# Patient Record
Sex: Female | Born: 1980 | Race: White | Hispanic: No | Smoking: Current every day smoker
Health system: Southern US, Community
[De-identification: ages and names within clinical notes are randomized; demographics above are authoritative.]

## PROBLEM LIST (undated history)

## (undated) ENCOUNTER — Emergency Department (HOSPITAL_COMMUNITY): Admission: EM | Payer: Medicaid - Out of State | Source: Home / Self Care

## (undated) DIAGNOSIS — J449 Chronic obstructive pulmonary disease, unspecified: Secondary | ICD-10-CM

## (undated) HISTORY — PX: CHOLECYSTECTOMY: SHX55

## (undated) HISTORY — PX: ABDOMINAL HYSTERECTOMY: SHX81

---

## 2012-10-02 DIAGNOSIS — R0602 Shortness of breath: Secondary | ICD-10-CM

## 2016-02-15 ENCOUNTER — Encounter: Payer: Self-pay | Admitting: Emergency Medicine

## 2016-02-15 ENCOUNTER — Emergency Department
Admission: EM | Admit: 2016-02-15 | Discharge: 2016-02-15 | Disposition: A | Discharge status: Home / Self Care | Payer: Medicaid - Out of State | Attending: Emergency Medicine | Admitting: Emergency Medicine

## 2016-02-15 DIAGNOSIS — Y999 Unspecified external cause status: Secondary | ICD-10-CM | POA: Insufficient documentation

## 2016-02-15 DIAGNOSIS — G8929 Other chronic pain: Secondary | ICD-10-CM | POA: Insufficient documentation

## 2016-02-15 DIAGNOSIS — F17210 Nicotine dependence, cigarettes, uncomplicated: Secondary | ICD-10-CM | POA: Insufficient documentation

## 2016-02-15 DIAGNOSIS — X500XXA Overexertion from strenuous movement or load, initial encounter: Secondary | ICD-10-CM | POA: Insufficient documentation

## 2016-02-15 DIAGNOSIS — M5442 Lumbago with sciatica, left side: Secondary | ICD-10-CM | POA: Insufficient documentation

## 2016-02-15 DIAGNOSIS — Y92009 Unspecified place in unspecified non-institutional (private) residence as the place of occurrence of the external cause: Secondary | ICD-10-CM | POA: Insufficient documentation

## 2016-02-15 DIAGNOSIS — Y9389 Activity, other specified: Secondary | ICD-10-CM | POA: Insufficient documentation

## 2016-02-15 LAB — URINALYSIS COMPLETE WITH MICROSCOPIC (ARMC ONLY)
BILIRUBIN URINE: NEGATIVE
Glucose, UA: NEGATIVE mg/dL
KETONES UR: NEGATIVE mg/dL
LEUKOCYTES UA: NEGATIVE
NITRITE: POSITIVE — AB
PH: 5 (ref 5.0–8.0)
PROTEIN: NEGATIVE mg/dL
SPECIFIC GRAVITY, URINE: 1.011 (ref 1.005–1.030)

## 2016-02-15 MED ORDER — ORPHENADRINE CITRATE ER 100 MG PO TB12: 100.0000 mg | ORAL_TABLET | Freq: Two times a day (BID) | ORAL | 0 refills | Status: AC | PRN

## 2016-02-15 MED ORDER — DEXAMETHASONE SODIUM PHOSPHATE 10 MG/ML IJ SOLN
10.0000 mg | Freq: Once | INTRAMUSCULAR | Status: AC
  Administered 2016-02-15: 10 mg via INTRAMUSCULAR
  Filled 2016-02-15: qty 1

## 2016-02-15 MED ORDER — PREDNISONE 10 MG PO TABS: 10.0000 mg | ORAL_TABLET | Freq: Two times a day (BID) | ORAL | 0 refills | Status: AC

## 2016-02-15 MED ORDER — ORPHENADRINE CITRATE 30 MG/ML IJ SOLN
60.0000 mg | INTRAMUSCULAR | Status: AC
  Administered 2016-02-15: 60 mg via INTRAMUSCULAR
  Filled 2016-02-15: qty 2

## 2016-02-15 MED ORDER — OXYCODONE-ACETAMINOPHEN 5-325 MG PO TABS
1.0000 | ORAL_TABLET | Freq: Once | ORAL | Status: AC
  Administered 2016-02-15: 1 via ORAL
  Filled 2016-02-15: qty 1

## 2016-02-15 MED ORDER — HYDROCODONE-ACETAMINOPHEN 5-325 MG PO TABS: 1.0000 | ORAL_TABLET | Freq: Four times a day (QID) | ORAL | 0 refills | Status: AC | PRN

## 2016-02-15 NOTE — ED Notes (Signed)
Pt states she took her own ibuprofen because "I don't like hospitals."

## 2016-02-15 NOTE — ED Triage Notes (Signed)
Pt has hx of chronic back pain and today was picking up boxes and reinjured back. Pt has left sided back pain radiates down left buttocks and posterior left leg.

## 2016-02-15 NOTE — ED Notes (Signed)
Pt in via triage; reports helping a friend move today, straining back because boxes were too heavy.  Pt with complaints of left sciatic pain.  Pt with hx of same.  Pt to room via wheelchair; reports difficulty ambulating due to pain.  No immediate distress noted at this time.

## 2016-02-15 NOTE — Discharge Instructions (Signed)
Take the prescription meds as directed. Apply ice to the sore muscles of the lower back for additional symptom relief. Follow-up with [facility] for further treatment.   Emergency care providers appreciate that many patients coming to us are in severe pain and we wish to address their pain in the safest, most responsible manner.  It is important to recognize, however, that the proper treatment of chronic pain differs from that of the pain of injuries and acute illnesses.  Our goal is to provider quality, safe, personalized care and we thank you for giving us the opportunity to serve you.  The use of narcotics and related agents for chronic pain syndromes may lead to additional physical and psychological problems.  Nearly as many people die from prescription narcotics each year as die from car crashes.  Additionally, this risk is increased if such prescriptions are obtained from a variety of sources.  Therefore, only your primary care physician or a pain management specialist is able to safely treat such syndromes with narcotic medications long-term.  Documentation revealing such prescriptions have been sought from multiple sources may prohibit us from providing a refill or different narcotic medication.  Your name may be checked first through the [facility] Controlled Substances Reporting System.  This database is a record of controlled substance medication prescriptions that the patient has received.  This has been established by [facility] in an effort to eliminate the dangerous, and often life-threatening, practice of obtaining multiple prescriptions from different medical providers.  If you have a chronic pain syndrome (i.e. chronic headaches, recurrent back or neck pain, dental pain, abdominal or pelvic pain without a specific diagnosis, or neuropathic pain such as fibromyalgia) or recurrent visits for the same condition without an acute diagnosis, you may be treated with non-narcotics and  other non-addictive medicines.  Allergic reactions or negative side effects that may be reported by a patient to such medications will not typically lead to the use of a narcotic analgesic or other controlled substance as an alternative.  Patients managing chronic pain with a personal physician should have provisions in place for breakthrough pain.  If you are in crisis, you should call your physician.  If your physician directs you to the emergency department, please have the doctor call and speak to our attending physician concerning your care.  When patients come to the Emergency Department (ED) with acute medical conditions in which the ED physician feels it is appropriate to prescribe narcotic or sedating pain medication, the physician will prescribe these in very limited quantities.  The amount of these medications will last only until you can see your primary care physician in his/her office.  Any patient who returns to the ED seeking refills should expect only non-narcotic pain medications.  In the event an acute medical condition exists and the emergency physician feels it is necessary that the patient be given a narcotic or sedating medication, a responsible adult driver should be present in the room prior to the medication being given by the nurse.  Prescriptions for narcotic or sedating medications that have been lost, stolen, or expired will NOT be refilled in the ED.  Patients who have chronic pain may receive non-narcotic prescriptions until seen by their primary care physician.  It is every patient's personal responsibility to maintain active prescriptions with his or her primary care physician or specialist.

## 2016-02-20 ENCOUNTER — Emergency Department: Payer: Self-pay

## 2016-02-20 ENCOUNTER — Emergency Department
Admission: EM | Admit: 2016-02-20 | Discharge: 2016-02-20 | Disposition: A | Discharge status: Home / Self Care | Payer: Self-pay | Attending: Emergency Medicine | Admitting: Emergency Medicine

## 2016-02-20 ENCOUNTER — Encounter: Payer: Self-pay | Admitting: Emergency Medicine

## 2016-02-20 DIAGNOSIS — R1013 Epigastric pain: Secondary | ICD-10-CM | POA: Insufficient documentation

## 2016-02-20 DIAGNOSIS — R1310 Dysphagia, unspecified: Secondary | ICD-10-CM | POA: Insufficient documentation

## 2016-02-20 DIAGNOSIS — R079 Chest pain, unspecified: Secondary | ICD-10-CM

## 2016-02-20 DIAGNOSIS — F17210 Nicotine dependence, cigarettes, uncomplicated: Secondary | ICD-10-CM | POA: Insufficient documentation

## 2016-02-20 DIAGNOSIS — Z79899 Other long term (current) drug therapy: Secondary | ICD-10-CM | POA: Insufficient documentation

## 2016-02-20 MED ORDER — FAMOTIDINE IN NACL 20-0.9 MG/50ML-% IV SOLN
20.0000 mg | Freq: Once | INTRAVENOUS | Status: AC
  Administered 2016-02-20: 20 mg via INTRAVENOUS
  Filled 2016-02-20: qty 50

## 2016-02-20 MED ORDER — OMEPRAZOLE 40 MG PO CPDR: 40.0000 mg | DELAYED_RELEASE_CAPSULE | Freq: Every day | ORAL | 0 refills | Status: AC

## 2016-02-20 MED ORDER — FENTANYL CITRATE (PF) 100 MCG/2ML IJ SOLN
50.0000 ug | Freq: Once | INTRAMUSCULAR | Status: AC
  Administered 2016-02-20: 50 ug via INTRAVENOUS
  Filled 2016-02-20: qty 2

## 2016-02-20 MED ORDER — ONDANSETRON HCL 4 MG/2ML IJ SOLN
4.0000 mg | Freq: Once | INTRAMUSCULAR | Status: AC
  Administered 2016-02-20: 4 mg via INTRAVENOUS
  Filled 2016-02-20: qty 2

## 2016-02-20 NOTE — ED Provider Notes (Signed)
[facility] Emergency Department Provider Note ____________________________________________  Time seen: 2108  I have reviewed the triage vital signs and the nursing notes.  HISTORY  Chief Complaint  Back Pain  HPI Sandy Becker is a 35 y.o. female presents to the ED accompanied by family friend for evaluation of pain which is consistent with her left-sided sciatica after injury today. Patient describes she was moving furniture and boxes at home, when she apparently "over did it."She denies any incontinence of bladder or bowel. She reports her baseline pain with referral down the left buttocks into the foot. She describes difficulty walking and transitioning. She denies any fall, accident, or trauma. He reports she is recently relocated from [place], and has not had any recent prescriptions for her chronic pain medicines which include hydrocodone 10 mg. She also denies taking any other medications in the interim for pain relief. She presents now for evaluation and management. She notes that in the past when she has reported to the emergency department, her pain has beenaddressed with Dilaudid injections.  No past medical history on file.  There are no active problems to display for this patient.  Past Surgical History:  Procedure Laterality Date  . ABDOMINAL HYSTERECTOMY     partial  . CHOLECYSTECTOMY      Prior to Admission medications   Medication Sig Start Date End Date Taking? Authorizing Provider  HYDROcodone-acetaminophen (NORCO) 5-325 MG tablet Take 1 tablet by mouth every 6 (six) hours as needed. 02/15/16   Tammy Ericsson V Bacon Fredis Malkiewicz, PA-C  orphenadrine (NORFLEX) 100 MG tablet Take 1 tablet (100 mg total) by mouth 2 (two) times daily as needed for muscle spasms. 02/15/16   Arely Tinner V Bacon Anarosa Kubisiak, PA-C  predniSONE (DELTASONE) 10 MG tablet Take 1 tablet (10 mg total) by mouth 2 (two) times daily with a meal. 02/15/16   Malani Lees V Bacon Agnes Probert, PA-C     Allergies Prednisone; Red dye; Toradol [ketorolac tromethamine]; and Tramadol  No family history on file.  Social History Social History  Substance Use Topics  . Smoking status: Current Every Day Smoker    Packs/day: 0.50    Types: Cigarettes  . Smokeless tobacco: Never Used  . Alcohol use No    Review of Systems  Constitutional: Negative for fever. Cardiovascular: Negative for chest pain. Respiratory: Negative for shortness of breath. Gastrointestinal: Negative for abdominal pain, vomiting and diarrhea. Genitourinary: Negative for dysuria. Musculoskeletal: Positive for left-sided lower back pain. Skin: Negative for rash. Neurological: Negative for headaches, focal weakness or numbness. ____________________________________________  PHYSICAL EXAM:  VITAL SIGNS: ED Triage Vitals  Enc Vitals Group     BP 02/15/16 1932 136/82     Pulse Rate 02/15/16 1930 (!) 101     Resp 02/15/16 1930 18     Temp 02/15/16 1930 97.7 F (36.5 C)     Temp Source 02/15/16 1930 Oral     SpO2 02/15/16 1930 97 %     Weight 02/15/16 1930 190 lb (86.2 kg)     Height 02/15/16 1930 5\' 5"  (1.651 m)     Head Circumference --      Peak Flow --      Pain Score 02/15/16 1931 9     Pain Loc --      Pain Edu? --      Excl. in GC? --    Constitutional: Alert and oriented. Well appearing and in no distress. Head: Normocephalic and atraumatic. Cardiovascular: Normal rate, regular rhythm. Normal distal pulses. Respiratory:  Normal respiratory effort. No wheezes/rales/rhonchi. Gastrointestinal: Soft and nontender. No distention. Musculoskeletal: Normal spinal alignment without midline tenderness, spasm, deformity, or step-off. Patient is able transition from supine to sit without assistance. She's got a negative CT history leg raise bilaterally. His able transition from sit to stand without difficulty. Her toe raise and heel raise is strong. She has normal single leg stand. Nontender with normal range  of motion in all extremities.  Neurologic:  Antalgic gait without ataxia. Cranial nerves II through XII grossly intact. Normal LE DTRs bilaterally. Normal toe dorsiflexion and foot eversion on exam. Normal speech and language. No gross focal neurologic deficits are appreciated. Skin:  Skin is warm, dry and intact. No rash noted. Psychiatric: Mood and affect are normal. Patient exhibits appropriate insight and judgment. ____________________________________________   RADIOLOGY Deferred ____________________________________________  PROCEDURES  Decadron 10 mg IM Norflex 60 mg IM ____________________________________________  INITIAL IMPRESSION / ASSESSMENT AND PLAN / ED COURSE  Patient was systems consistent with her chronic left-sided low back pain and sciatica. No indication of any acute neuromuscular deficit on exam. Her pain is improved following IM injection administration. She is discharged at this time with prescriptions for prednisone, Norflex, and hydrocodone 5-3 25 (#10). She will follow with [facility] for ongoing symptom management. Review of the [place] and [place] controlled substance database is a did not reveal any current prescriptions for narcotic pain medicines.  Clinical Course    ____________________________________________  FINAL CLINICAL IMPRESSION(S) / ED DIAGNOSES  Final diagnoses:  Chronic left-sided low back pain with left-sided sciatica     Lissa HoardJenise V Bacon Arvin Abello, PA-C 02/20/16 1310    Phineas SemenGraydon Goodman, MD 02/23/16 [phone]

## 2016-02-20 NOTE — Discharge Instructions (Signed)
Please drink any fluid to stay well-hydrated. You may take Tylenol for your pain, but avoid NSAID medications until you have followed up with the gastroenterologist.  Return to the emergency department if you develop severe pain, inability to keep down fluids, fever, or any other symptoms concerning to you.

## 2016-02-20 NOTE — ED Notes (Addendum)
Pt having no issues at this time with PO challenge , MD notified

## 2016-02-20 NOTE — ED Triage Notes (Signed)
C/O "something is stuck or blocking" in esophagus   All PO intake "comes back up" x 3 days.

## 2016-02-20 NOTE — ED Provider Notes (Signed)
[facility] Emergency Department Provider Note  ____________________________________________  Time seen: Approximately 2:38 PM  I have reviewed the triage vital signs and the nursing notes.   HISTORY  Chief Complaint unable to eat or drink     HPI Sandy Becker is a 35 y.o. female presenting for acute pain with vomiting with swallowing. The patient reports that for the past 3 days, anytime she tries to eat or drink anything, she develops a severe substernal pain and then coughs or vomits. She does not remember any acute episode where this may have started. She has not had any associated shortness of breath, drooling, fever.  Patient was seen here 11/2 for sciatica and treated with Norflex, and prednisone.   History reviewed. No pertinent past medical history.  There are no active problems to display for this patient.   Past Surgical History:  Procedure Laterality Date  . ABDOMINAL HYSTERECTOMY     partial  . CHOLECYSTECTOMY      Current Outpatient Rx  . Order #: [id number] Class: Print  . Order #: [id number] Class: Print  . Order #: [id number] Class: Print  . Order #: [id number] Class: Print    Allergies Prednisone; Red dye; Toradol [ketorolac tromethamine]; and Tramadol  No family history on file.  Social History Social History  Substance Use Topics  . Smoking status: Current Every Day Smoker    Packs/day: 0.50    Types: Cigarettes  . Smokeless tobacco: Never Used  . Alcohol use No    Review of Systems Constitutional: No fever/chills. Eyes: No visual changes. ENT: No sore throat. No congestion or rhinorrhea. Cardiovascular: Denies chest pain. Denies palpitations. Respiratory: Denies shortness of breath.  Positive cough with swallowing. Gastrointestinal: Positive epigastric abdominal pain.  No nausea, no vomiting.  No diarrhea.  No constipation. Genitourinary: Negative for dysuria. Musculoskeletal: Negative for back pain. Skin:  Negative for rash. Neurological: Negative for headaches. No focal numbness, tingling or weakness.   10-point ROS otherwise negative.  ____________________________________________   PHYSICAL EXAM:  VITAL SIGNS: ED Triage Vitals  Enc Vitals Group     BP 02/20/16 1259 117/88     Pulse Rate 02/20/16 1259 88     Resp 02/20/16 1259 16     Temp 02/20/16 1253 98.1 F (36.7 C)     Temp Source 02/20/16 1253 Oral     SpO2 02/20/16 1259 98 %     Weight 02/20/16 1254 180 lb (81.6 kg)     Height 02/20/16 1254 5\' 5"  (1.651 m)     Head Circumference --      Peak Flow --      Pain Score 02/20/16 1254 0     Pain Loc --      Pain Edu? --      Excl. in GC? --     Constitutional: Alert and oriented. Well appearing and in no acute distress. Answers questions appropriately. Eyes: Conjunctivae are normal.  EOMI. No scleral icterus. Head: Atraumatic.No meningismus. Nose: No congestion/rhinnorhea. Mouth/Throat: Mucous membranes are moist. No drooling. Posterior pharynx is without erythema, tonsillar swelling or exudate. The posterior palate is symmetric and uvula is midline. Neck: No stridor.  Supple.  No meningismus. Cardiovascular: Normal rate, regular rhythm. No murmurs, rubs or gallops.  Respiratory: Normal respiratory effort.  No accessory muscle use or retractions. Lungs CTAB.  No wheezes, rales or ronchi. Gastrointestinal: Soft, nontender and nondistended.  No guarding or rebound.  No peritoneal signs. Musculoskeletal: No LE edema. No ttp in the calves or palpable cords.  Negative Homan's sign. Neurologic:  A&Ox3.  Speech is clear.  Face and smile are symmetric.  EOMI.  Moves all extremities well. Skin:  Skin is warm, dry and intact. No rash noted. Psychiatric: Mood and affect are normal. Speech and behavior are normal.  Normal judgement.  ____________________________________________   LABS (all labs ordered are listed, but only abnormal results are displayed)  Labs Reviewed - No data  to display ____________________________________________  EKG  ED ECG REPORT I, Rockne MenghiniNorman, Anne-Caroline, the attending physician, personally viewed and interpreted this ECG.   Date: 02/20/2016  EKG Time: 1524  Rate: 68  Rhythm: normal sinus rhythm  Axis: normal  Intervals:none  ST&T Change: Nonspecific T-wave inversion in V1. No ST elevation.  ____________________________________________  RADIOLOGY  Dg Chest 2 View  Result Date: 02/20/2016 CLINICAL DATA:  Difficulty swallowing and unable the past any oral intake EXAM: CHEST  2 VIEW COMPARISON:  None. FINDINGS: Cardiac shadow is within normal limits. The lungs are well aerated bilaterally. No significant dilatation of the esophagus is noted. No bony abnormality is seen. IMPRESSION: No active cardiopulmonary disease. Electronically Signed   By: Alcide CleverMark  Lukens M.D.   On: 02/20/2016 15:00    ____________________________________________   PROCEDURES  Procedure(s) performed: None  Procedures  Critical Care performed: No ____________________________________________   INITIAL IMPRESSION / ASSESSMENT AND PLAN / ED COURSE  Pertinent labs & imaging results that were available during my care of the patient were reviewed by me and considered in my medical decision making (see chart for details).  35 y.o. female presenting with chest pain, coughing and vomiting with by mouth intake for the past 3 days. The patient has stable vital signs and no acute cardiopulmonary findings on my examination. I have done a bedside swallow with a small sip of water and the patient immediately had a central discomfort without any coughing or vomiting. She was able to keep the water down. It is possible the patient has some gastric irritation from her recent steroid course. I would also consider foreign body and we'll get a chest x-ray to evaluate for this. I will treat the patient medically and reevaluate her. If she is still having severe pain and unable to  swallow without severe discomfort, I'll consider talking with GI for endoscopy.  ----------------------------------------- 3:37 PM on 02/20/2016 -----------------------------------------  The patient's symptoms have significantly improved. We have repeated a swallow study, she did not have any difficulty swallowing, cough, vomiting, or acute pain. The patient's chest x-ray does not show any acute process. Her EKG does not show any ischemic changes. I will discharge her home with omeprazole for possible gastric irritation after steroid course, and the information for GI follow-up. I am planning discharge at this time. Return precautions as well as follow-up instructions were discussed. ____________________________________________  FINAL CLINICAL IMPRESSION(S) / ED DIAGNOSES  Final diagnoses:  Chest pain, unspecified type  Pain with swallowing    Clinical Course       NEW MEDICATIONS STARTED DURING THIS VISIT:  New Prescriptions   OMEPRAZOLE (PRILOSEC) 40 MG CAPSULE    Take 1 capsule (40 mg total) by mouth daily.      Rockne MenghiniAnne-Caroline Atzel Mccambridge, MD 02/20/16 1538

## 2016-02-25 ENCOUNTER — Emergency Department
Admission: EM | Admit: 2016-02-25 | Discharge: 2016-02-25 | Disposition: A | Discharge status: Home / Self Care | Payer: Medicaid - Out of State | Attending: Emergency Medicine | Admitting: Emergency Medicine

## 2016-02-25 ENCOUNTER — Emergency Department: Payer: Medicaid - Out of State

## 2016-02-25 ENCOUNTER — Encounter: Payer: Self-pay | Admitting: Emergency Medicine

## 2016-02-25 DIAGNOSIS — Z79899 Other long term (current) drug therapy: Secondary | ICD-10-CM | POA: Diagnosis not present

## 2016-02-25 DIAGNOSIS — J069 Acute upper respiratory infection, unspecified: Secondary | ICD-10-CM | POA: Diagnosis not present

## 2016-02-25 DIAGNOSIS — R059 Cough, unspecified: Secondary | ICD-10-CM

## 2016-02-25 DIAGNOSIS — R05 Cough: Secondary | ICD-10-CM

## 2016-02-25 DIAGNOSIS — F17210 Nicotine dependence, cigarettes, uncomplicated: Secondary | ICD-10-CM | POA: Insufficient documentation

## 2016-02-25 MED ORDER — NAPROXEN 500 MG PO TABS: 500.0000 mg | ORAL_TABLET | Freq: Two times a day (BID) | ORAL | 0 refills | Status: AC

## 2016-02-25 MED ORDER — PROMETHAZINE-DM 6.25-15 MG/5ML PO SYRP: 5.0000 mL | ORAL_SOLUTION | Freq: Four times a day (QID) | ORAL | 0 refills | Status: AC | PRN

## 2016-02-25 MED ORDER — LORATADINE 10 MG PO TABS: 10.0000 mg | ORAL_TABLET | Freq: Every day | ORAL | 0 refills | Status: AC

## 2016-02-25 NOTE — ED Triage Notes (Signed)
Left rib/ chest pain x 2 days.  Pain worsening.  Also c/o non productive cough, pain with cough and breathing.  States took 800 mg Ibuprofen about one hour PTA.  States has been taking ibuprofen with out relief.  Denies fever.

## 2016-02-25 NOTE — ED Notes (Signed)

## 2016-02-25 NOTE — ED Provider Notes (Signed)
[facility] Emergency Department Provider Note  ____________________________________________  Time seen: Approximately 2:37 PM  I have reviewed the triage vital signs and the nursing notes.   HISTORY  Chief Complaint Cough and Chest Pain    HPI Madonna M Clearnce SorrelWhittle is a 35 y.o. female , NAD, presents to emergency room with one-day history of cough and chest congestion. Patient states she had gradual onset of cough, chest congestion, runny nose and nasal congestion as of yesterday. Felt it was related to being outside of the weather. Has a history of COPD and has been smoking tobacco cigarettes for greater than 20 years. Denies any wheezing or shortness of breath at this time. States it hurts when she takes a deep breath or has a deep cough. Has not noted any production to her cough. Denies any hemoptysis. Denies any palpitations, abdominal pain, nausea, vomiting. Denies ear pain, sinus pressure or pain. Has not had any headaches, numbness, weakness, tingling. Has not had any fevers, chills, body aches. No known sick contacts.   History reviewed. No pertinent past medical history.  There are no active problems to display for this patient.   Past Surgical History:  Procedure Laterality Date  . ABDOMINAL HYSTERECTOMY     partial  . CHOLECYSTECTOMY      Prior to Admission medications   Medication Sig Start Date End Date Taking? Authorizing Provider  HYDROcodone-acetaminophen (NORCO) 5-325 MG tablet Take 1 tablet by mouth every 6 (six) hours as needed. 02/15/16   Jenise V Bacon Menshew, PA-C  loratadine (CLARITIN) 10 MG tablet Take 1 tablet (10 mg total) by mouth daily. 02/25/16   Jayd Forrey L Aleksey Newbern, PA-C  naproxen (NAPROSYN) 500 MG tablet Take 1 tablet (500 mg total) by mouth 2 (two) times daily with a meal. 02/25/16   Vilda Zollner L Shonette Rhames, PA-C  omeprazole (PRILOSEC) 40 MG capsule Take 1 capsule (40 mg total) by mouth daily. 02/20/16 02/19/17  Anne-Caroline Sharma CovertNorman, MD   orphenadrine (NORFLEX) 100 MG tablet Take 1 tablet (100 mg total) by mouth 2 (two) times daily as needed for muscle spasms. 02/15/16   Jenise V Bacon Menshew, PA-C  predniSONE (DELTASONE) 10 MG tablet Take 1 tablet (10 mg total) by mouth 2 (two) times daily with a meal. 02/15/16   Jenise V Bacon Menshew, PA-C  promethazine-dextromethorphan (PROMETHAZINE-DM) 6.25-15 MG/5ML syrup Take 5 mLs by mouth 4 (four) times daily as needed for cough. 02/25/16   Eziah Negro L Cristan Hout, PA-C    Allergies Prednisone; Red dye; Toradol [ketorolac tromethamine]; and Tramadol  No family history on file.  Social History Social History  Substance Use Topics  . Smoking status: Current Every Day Smoker    Packs/day: 0.50    Types: Cigarettes  . Smokeless tobacco: Never Used  . Alcohol use No     Review of Systems  Constitutional: No fever/chills, Fatigue ENT: Positive nasal congestion, runny nose. No sore throat or ear pain, sinus pressure. Cardiovascular: Pleuritic chest pain with cough only. Respiratory: Positive nonproductive cough. No shortness of breath. No wheezing.  Gastrointestinal: No abdominal pain.  No nausea, vomiting.  Musculoskeletal: Negative for myalgias.  Skin: Negative for rash. Neurological: Negative for headaches, focal weakness or numbness. No tingling. 10-point ROS otherwise negative.  ____________________________________________   PHYSICAL EXAM:  VITAL SIGNS: ED Triage Vitals  Enc Vitals Group     BP 02/25/16 1317 118/75     Pulse Rate 02/25/16 1317 98     Resp 02/25/16 1317 18     Temp 02/25/16  1317 98 F (36.7 C)     Temp Source 02/25/16 1317 Oral     SpO2 02/25/16 1317 99 %     Weight 02/25/16 1316 180 lb (81.6 kg)     Height 02/25/16 1316 5\' 5"  (1.651 m)     Head Circumference --      Peak Flow --      Pain Score 02/25/16 1316 9     Pain Loc --      Pain Edu? --      Excl. in GC? --      Constitutional: Alert and oriented. Well appearing and in no acute  distress. Eyes: Conjunctivae are normal.  Head: Atraumatic. ENT:      Ears: TMs visual loss bilaterally without erythema, effusion, bulging.      Nose: Mild congestion with trace purulent rhinorrhea.      Mouth/Throat: Mucous membranes are moist. Pharynx without erythema, swelling, exudate. Clear postnasal drip. Uvula is midline. Airway is patent. Neck: No stridor, no carotid bruits. No cervical spine tenderness to palpation. Supple with full range of motion. Hematological/Lymphatic/Immunilogical: No cervical lymphadenopathy. Cardiovascular: Normal rate, regular rhythm. Normal S1 and S2.  Good peripheral circulation. Respiratory: Normal respiratory effort without tachypnea or retractions. Lungs CTAB with breath sounds noted in all lung fields. No wheeze, rhonchi, rales. Neurologic:  Normal speech and language. No gross focal neurologic deficits are appreciated.  Skin:  Skin is warm, dry and intact. No rash noted. Psychiatric: Mood and affect are normal. Speech and behavior are normal. Patient exhibits appropriate insight and judgement.   ____________________________________________   LABS  None ____________________________________________  EKG  EKG with normal sinus rhythm with a ventricular rate of 98 bpm. No evidence of STEMI or acute changes. EKG also reviewed by Dr. Phineas SemenGraydon Goodman. ____________________________________________  RADIOLOGY I, Hope PigeonJami L Rozell Theiler, personally viewed and evaluated these images (plain radiographs) as part of my medical decision making, as well as reviewing the written report by the radiologist.  Dg Chest 2 View  Result Date: 02/25/2016 CLINICAL DATA:  Shortness of breath. EXAM: CHEST  2 VIEW COMPARISON:  02/20/2016 FINDINGS: The heart size and mediastinal contours are within normal limits. There is no evidence of pulmonary edema, consolidation, pneumothorax, nodule or pleural fluid. The visualized skeletal structures are unremarkable. IMPRESSION: No active  cardiopulmonary disease. Electronically Signed   By: Irish LackGlenn  Yamagata M.D.   On: 02/25/2016 14:02    ____________________________________________    PROCEDURES  Procedure(s) performed: None   Procedures   Medications - No data to display   ____________________________________________   INITIAL IMPRESSION / ASSESSMENT AND PLAN / ED COURSE  Pertinent labs & imaging results that were available during my care of the patient were reviewed by me and considered in my medical decision making (see chart for details).  Clinical Course     Patient's diagnosis is consistent with Viral URI. Patient will be discharged home with prescriptions for Claritin, Naprosyn and promethazine DM to take as directed. Patient may use her albuterol inhaler that she has at home as needed for any shortness breath or wheezing. [facility] if symptoms persist past this treatment course. Patient is given ED precautions to return to the ED for any worsening or new symptoms.    ____________________________________________  FINAL CLINICAL IMPRESSION(S) / ED DIAGNOSES  Final diagnoses:  Cough  Viral upper respiratory tract infection      NEW MEDICATIONS STARTED DURING THIS VISIT:  Discharge Medication List as of 02/25/2016  2:59 PM    START taking these medications   Details  loratadine (CLARITIN) 10 MG tablet Take 1 tablet (10 mg total) by mouth daily., Starting Sun 02/25/2016, Print    naproxen (NAPROSYN) 500 MG tablet Take 1 tablet (500 mg total) by mouth 2 (two) times daily with a meal., Starting Sun 02/25/2016, Print    promethazine-dextromethorphan (PROMETHAZINE-DM) 6.25-15 MG/5ML syrup Take 5 mLs by mouth 4 (four) times daily as needed for cough., Starting Sun 02/25/2016, Print             Hope Pigeon, PA-C 02/25/16 1735    Phineas Semen, MD 02/25/16 [phone]

## 2016-03-06 ENCOUNTER — Inpatient Hospital Stay (HOSPITAL_COMMUNITY)
Admit: 2016-03-06 | Discharge: 2016-03-06 | Disposition: A | Discharge status: Home / Self Care | Payer: Medicaid - Out of State | Attending: Pulmonary Disease | Admitting: Pulmonary Disease

## 2016-03-06 ENCOUNTER — Encounter: Payer: Self-pay | Admitting: *Deleted

## 2016-03-06 ENCOUNTER — Emergency Department: Payer: Medicaid - Out of State

## 2016-03-06 ENCOUNTER — Inpatient Hospital Stay
Admission: EM | Admit: 2016-03-06 | Discharge: 2016-03-07 | DRG: 917 | Disposition: A | Discharge status: Home / Self Care | Payer: Medicaid - Out of State | Attending: Internal Medicine | Admitting: Internal Medicine

## 2016-03-06 DIAGNOSIS — T50904A Poisoning by unspecified drugs, medicaments and biological substances, undetermined, initial encounter: Secondary | ICD-10-CM | POA: Diagnosis present

## 2016-03-06 DIAGNOSIS — R4189 Other symptoms and signs involving cognitive functions and awareness: Secondary | ICD-10-CM | POA: Diagnosis not present

## 2016-03-06 DIAGNOSIS — R55 Syncope and collapse: Secondary | ICD-10-CM | POA: Diagnosis present

## 2016-03-06 DIAGNOSIS — Z9049 Acquired absence of other specified parts of digestive tract: Secondary | ICD-10-CM

## 2016-03-06 DIAGNOSIS — Z90710 Acquired absence of both cervix and uterus: Secondary | ICD-10-CM | POA: Diagnosis not present

## 2016-03-06 DIAGNOSIS — F17210 Nicotine dependence, cigarettes, uncomplicated: Secondary | ICD-10-CM | POA: Diagnosis present

## 2016-03-06 DIAGNOSIS — Z79899 Other long term (current) drug therapy: Secondary | ICD-10-CM | POA: Diagnosis not present

## 2016-03-06 DIAGNOSIS — Z888 Allergy status to other drugs, medicaments and biological substances status: Secondary | ICD-10-CM | POA: Diagnosis not present

## 2016-03-06 DIAGNOSIS — Z720 Tobacco use: Secondary | ICD-10-CM

## 2016-03-06 DIAGNOSIS — Z9102 Food additives allergy status: Secondary | ICD-10-CM | POA: Diagnosis not present

## 2016-03-06 DIAGNOSIS — T4274XA Poisoning by unspecified antiepileptic and sedative-hypnotic drugs, undetermined, initial encounter: Secondary | ICD-10-CM | POA: Diagnosis present

## 2016-03-06 DIAGNOSIS — Z791 Long term (current) use of non-steroidal anti-inflammatories (NSAID): Secondary | ICD-10-CM | POA: Diagnosis not present

## 2016-03-06 DIAGNOSIS — Z885 Allergy status to narcotic agent status: Secondary | ICD-10-CM

## 2016-03-06 DIAGNOSIS — F1310 Sedative, hypnotic or anxiolytic abuse, uncomplicated: Secondary | ICD-10-CM | POA: Diagnosis present

## 2016-03-06 DIAGNOSIS — Z7952 Long term (current) use of systemic steroids: Secondary | ICD-10-CM

## 2016-03-06 DIAGNOSIS — J9600 Acute respiratory failure, unspecified whether with hypoxia or hypercapnia: Secondary | ICD-10-CM | POA: Diagnosis present

## 2016-03-06 DIAGNOSIS — T50901A Poisoning by unspecified drugs, medicaments and biological substances, accidental (unintentional), initial encounter: Secondary | ICD-10-CM | POA: Diagnosis present

## 2016-03-06 DIAGNOSIS — Z886 Allergy status to analgesic agent status: Secondary | ICD-10-CM | POA: Diagnosis not present

## 2016-03-06 DIAGNOSIS — T4271XA Poisoning by unspecified antiepileptic and sedative-hypnotic drugs, accidental (unintentional), initial encounter: Secondary | ICD-10-CM

## 2016-03-06 LAB — ECHOCARDIOGRAM COMPLETE
Height: 66 in
Weight: 3520.31 oz

## 2016-03-06 LAB — BLOOD GAS, ARTERIAL
ACID-BASE DEFICIT: 5.5 mmol/L — AB (ref 0.0–2.0)
BICARBONATE: 20.7 mmol/L (ref 20.0–28.0)
FIO2: 0.4
MECHVT: 500 mL
Mechanical Rate: 14
O2 SAT: 99.5 %
PCO2 ART: 42 mmHg (ref 32.0–48.0)
PO2 ART: 185 mmHg — AB (ref 83.0–108.0)
Patient temperature: 37
pH, Arterial: 7.3 — ABNORMAL LOW (ref 7.350–7.450)

## 2016-03-06 LAB — URINE DRUG SCREEN, QUALITATIVE (ARMC ONLY)
Amphetamines, Ur Screen: NOT DETECTED
Barbiturates, Ur Screen: POSITIVE — AB
Benzodiazepine, Ur Scrn: POSITIVE — AB
CANNABINOID 50 NG, UR ~~LOC~~: NOT DETECTED
COCAINE METABOLITE, UR ~~LOC~~: NOT DETECTED
MDMA (ECSTASY) UR SCREEN: NOT DETECTED
Methadone Scn, Ur: NOT DETECTED
OPIATE, UR SCREEN: NOT DETECTED
PHENCYCLIDINE (PCP) UR S: NOT DETECTED
Tricyclic, Ur Screen: NOT DETECTED

## 2016-03-06 LAB — BASIC METABOLIC PANEL
ANION GAP: 8 (ref 5–15)
BUN: 13 mg/dL (ref 6–20)
CALCIUM: 8.5 mg/dL — AB (ref 8.9–10.3)
CO2: 24 mmol/L (ref 22–32)
Chloride: 108 mmol/L (ref 101–111)
Creatinine, Ser: 0.95 mg/dL (ref 0.44–1.00)
GFR calc Af Amer: >60 mL/min (ref >60)
GFR calc non Af Amer: >60 mL/min (ref >60)
GLUCOSE: 107 mg/dL — AB (ref 65–99)
Potassium: 3.8 mmol/L (ref 3.5–5.1)
Sodium: 140 mmol/L (ref 135–145)

## 2016-03-06 LAB — CBC
HCT: 37.5 % (ref 35.0–47.0)
HEMOGLOBIN: 12.8 g/dL (ref 12.0–16.0)
MCH: 30.9 pg (ref 26.0–34.0)
MCHC: 34.2 g/dL (ref 32.0–36.0)
MCV: 90.3 fL (ref 80.0–100.0)
Platelets: 273 10*3/uL (ref 150–440)
RBC: 4.15 MIL/uL (ref 3.80–5.20)
RDW: 12.8 % (ref 11.5–14.5)
WBC: 10 10*3/uL (ref 3.6–11.0)

## 2016-03-06 LAB — ACETAMINOPHEN LEVEL

## 2016-03-06 LAB — PREGNANCY, URINE: Preg Test, Ur: NEGATIVE

## 2016-03-06 LAB — SALICYLATE LEVEL: Salicylate Lvl: <7 mg/dL (ref 2.8–30.0)

## 2016-03-06 LAB — MRSA PCR SCREENING: MRSA by PCR: NEGATIVE

## 2016-03-06 LAB — TROPONIN I

## 2016-03-06 MED ORDER — FENTANYL BOLUS VIA INFUSION
50.0000 ug | INTRAVENOUS | Status: DC | PRN
  Filled 2016-03-06: qty 50

## 2016-03-06 MED ORDER — SUCCINYLCHOLINE CHLORIDE 20 MG/ML IJ SOLN
INTRAMUSCULAR | Status: AC | PRN
  Administered 2016-03-06: 120 mg via INTRAVENOUS

## 2016-03-06 MED ORDER — HEPARIN SODIUM (PORCINE) 5000 UNIT/ML IJ SOLN
5000.0000 [IU] | Freq: Three times a day (TID) | INTRAMUSCULAR | Status: DC
  Administered 2016-03-06: 5000 [IU] via SUBCUTANEOUS
  Filled 2016-03-06: qty 1

## 2016-03-06 MED ORDER — FENTANYL CITRATE (PF) 100 MCG/2ML IJ SOLN
50.0000 ug | Freq: Once | INTRAMUSCULAR | Status: AC
  Administered 2016-03-06: 50 ug via INTRAVENOUS

## 2016-03-06 MED ORDER — IBUPROFEN 400 MG PO TABS
800.0000 mg | ORAL_TABLET | ORAL | Status: DC | PRN
Start: 2016-03-06 — End: 2016-03-07
  Administered 2016-03-06: 800 mg via ORAL
  Filled 2016-03-06: qty 2

## 2016-03-06 MED ORDER — ROCURONIUM BROMIDE 50 MG/5ML IV SOLN
0.1400 mg/kg | Freq: Once | INTRAVENOUS | Status: AC
  Administered 2016-03-06: 10.4 mg via INTRAVENOUS

## 2016-03-06 MED ORDER — ORAL CARE MOUTH RINSE
15.0000 mL | Freq: Four times a day (QID) | OROMUCOSAL | Status: DC
  Administered 2016-03-07: 15 mL via OROMUCOSAL
  Filled 2016-03-06 (×2): qty 15

## 2016-03-06 MED ORDER — FENTANYL 2500MCG IN NS 250ML (10MCG/ML) PREMIX INFUSION
25.0000 ug/h | INTRAVENOUS | Status: DC
  Administered 2016-03-06: 50 ug/h via INTRAVENOUS
  Filled 2016-03-06: qty 250

## 2016-03-06 MED ORDER — FAMOTIDINE IN NACL 20-0.9 MG/50ML-% IV SOLN
20.0000 mg | Freq: Two times a day (BID) | INTRAVENOUS | Status: DC
  Administered 2016-03-06: 20 mg via INTRAVENOUS
  Filled 2016-03-06: qty 50

## 2016-03-06 MED ORDER — IBUPROFEN 400 MG PO TABS
600.0000 mg | ORAL_TABLET | ORAL | Status: DC | PRN
Start: 2016-03-06 — End: 2016-03-06

## 2016-03-06 MED ORDER — CHLORHEXIDINE GLUCONATE 0.12% ORAL RINSE (MEDLINE KIT)
15.0000 mL | Freq: Two times a day (BID) | OROMUCOSAL | Status: DC
  Filled 2016-03-06: qty 15

## 2016-03-06 MED ORDER — MIDAZOLAM HCL 2 MG/2ML IJ SOLN
2.0000 mg | INTRAMUSCULAR | Status: DC | PRN
  Filled 2016-03-06: qty 2

## 2016-03-06 MED ORDER — MIDAZOLAM HCL 2 MG/2ML IJ SOLN
2.0000 mg | INTRAMUSCULAR | Status: DC | PRN
  Administered 2016-03-06: 2 mg via INTRAVENOUS

## 2016-03-06 MED ORDER — FAMOTIDINE 20 MG PO TABS
20.0000 mg | ORAL_TABLET | Freq: Two times a day (BID) | ORAL | Status: DC
  Administered 2016-03-06: 20 mg via ORAL
  Filled 2016-03-06: qty 1

## 2016-03-06 MED ORDER — ETOMIDATE 2 MG/ML IV SOLN
20.0000 mg | Freq: Once | INTRAVENOUS | Status: AC
  Administered 2016-03-06: 20 mg via INTRAVENOUS

## 2016-03-06 MED ORDER — NICOTINE 7 MG/24HR TD PT24
7.0000 mg | MEDICATED_PATCH | Freq: Every day | TRANSDERMAL | Status: DC
  Administered 2016-03-06: 7 mg via TRANSDERMAL
  Filled 2016-03-06: qty 1

## 2016-03-06 MED ORDER — ACETAMINOPHEN 325 MG PO TABS
650.0000 mg | ORAL_TABLET | ORAL | Status: DC | PRN
  Administered 2016-03-06 (×2): 650 mg via ORAL
  Filled 2016-03-06 (×2): qty 2

## 2016-03-06 MED ORDER — SODIUM CHLORIDE 0.9 % IV SOLN
INTRAVENOUS | Status: DC
  Administered 2016-03-06: 03:00:00 via INTRAVENOUS

## 2016-03-06 MED ORDER — SODIUM CHLORIDE 0.9 % IV SOLN: 250.0000 mL | INTRAVENOUS | Status: DC | PRN

## 2016-03-06 MED ORDER — SODIUM CHLORIDE 0.9 % IV SOLN
INTRAVENOUS | Status: AC | PRN
  Administered 2016-03-06 (×2): 1000 mL via INTRAVENOUS

## 2016-03-06 MED ORDER — ENOXAPARIN SODIUM 40 MG/0.4ML ~~LOC~~ SOLN
40.0000 mg | SUBCUTANEOUS | Status: DC
  Administered 2016-03-06: 40 mg via SUBCUTANEOUS
  Filled 2016-03-06: qty 0.4

## 2016-03-06 NOTE — Progress Notes (Signed)
*  PRELIMINARY RESULTS* Echocardiogram 2D Echocardiogram has been performed.  Sandy Becker 03/06/2016, 3:10 PM 

## 2016-03-06 NOTE — Progress Notes (Signed)
Pt arrived to ICU16 pt intubated and shortly after being on unit pt. AAOx4 communicating with paper.pt has no Resp distress. NP notified , RT notified and pt being extubated the patient tolerated extubation well and on room air the patient talking and no resp distress noted.

## 2016-03-06 NOTE — Progress Notes (Addendum)
Pt requesting pain medication, therefore prn Motrin ordered for pain management pt and pts mother stated she has taken ibuprofen before without any side effects and tolerated medication well.  Sonda Rumbleana Krystina Strieter, AGNP  Pulmonary/Critical Care Pager [phone] (please enter 7 digits) PCCM Consult Pager [phone] (please enter 7 digits)

## 2016-03-06 NOTE — ED Triage Notes (Signed)
Pt arrived to the ED from home via EMS for alleged CPR. When EMS arrived the home the Pt had stable vital signs but unresponsive. Pt has a HX of benzo abuse with overdose. Pt is unresponsive upon arrival.

## 2016-03-06 NOTE — ED Notes (Signed)
Sherilyn CooterHenry RN called to confirm that they received the Pt's blood and urine.

## 2016-03-06 NOTE — H&P (Addendum)
PULMONARY / CRITICAL CARE MEDICINE   Name: Sandy Becker MRN: [id number] DOB: 1980-05-13    ADMISSION DATE:  03/06/2016 CONSULTATION DATE:  03/06/16  REFERRING MD:Dr. Darci Currentandolph N Brown  CHIEF COMPLAINT:  Drug Overdose  HISTORY OF PRESENT ILLNESS:   Sandy Becker is a 35 yo female with Hx of drug abuse. Patient went out to smoke a cigarette and was found unresponsive.  Family members called the EMS.  When the EMS arrived on the scene patient had stable vital signs.  [facility] where she was noted to be very somnolent and therefore was intubated for airway protection.  PCCM team was called to admit the patient.  Patient has a history of smoking, she has 3 previous ER visits this month alone. 1. For back pain, subsequently 4. Chest pain which occurred with swallowing. This episode. She was given omeprazole, she had a another admission in for  URI type symptoms. She tells me that she does not use drugs, though she would like "something for pain through the IV." Her tox screen was positive for benzos and barbiturates. MRSA screen was negative. CT of the head on 11/22 was negative  Upon arrival in the ICU, Her mental status had significantly improved, therefore, which she was extubated early in the morning.  History later in the day: Patient notes that she often has syncopal episodes because her blood pressure has been low for many years, and she is not sure why. She has never taken anything for low blood pressure.   PAST MEDICAL HISTORY :  She  has no past medical history on file.  PAST SURGICAL HISTORY: She  has a past surgical history that includes Cholecystectomy and Abdominal hysterectomy.  Allergies  Allergen Reactions  . Prednisone Anaphylaxis  . Red Dye Rash  . Toradol [Ketorolac Tromethamine] Rash  . Tramadol Rash    No current facility-administered medications on file prior to encounter.    Current Outpatient Prescriptions on File Prior to Encounter   Medication Sig  . HYDROcodone-acetaminophen (NORCO) 5-325 MG tablet Take 1 tablet by mouth every 6 (six) hours as needed.  . loratadine (CLARITIN) 10 MG tablet Take 1 tablet (10 mg total) by mouth daily.  . naproxen (NAPROSYN) 500 MG tablet Take 1 tablet (500 mg total) by mouth 2 (two) times daily with a meal.  . omeprazole (PRILOSEC) 40 MG capsule Take 1 capsule (40 mg total) by mouth daily.  . orphenadrine (NORFLEX) 100 MG tablet Take 1 tablet (100 mg total) by mouth 2 (two) times daily as needed for muscle spasms.  . predniSONE (DELTASONE) 10 MG tablet Take 1 tablet (10 mg total) by mouth 2 (two) times daily with a meal.  . promethazine-dextromethorphan (PROMETHAZINE-DM) 6.25-15 MG/5ML syrup Take 5 mLs by mouth 4 (four) times daily as needed for cough.    FAMILY HISTORY:  Her has no family status information on file.    SOCIAL HISTORY: She  reports that she has been smoking Cigarettes.  She has been smoking about 0.50 packs per day. She has never used smokeless tobacco. She reports that she does not drink alcohol or use drugs.  REVIEW OF SYSTEMS:   Unable to obtain as the patient is intubated and on ventilator.  SUBJECTIVE:  Unable to obtain as the patient is intubated and on ventilator  VITAL SIGNS: BP 107/69   Pulse 91   Resp (!) 22   Wt 99.8 kg (220 lb)   SpO2 100%   BMI 36.61 kg/m  HEMODYNAMICS:    VENTILATOR SETTINGS: Vent Mode: AC FiO2 (%):  [40 %] 40 % Set Rate:  [14 bmp] 14 bmp Vt Set:  [500 mL] 500 mL PEEP:  [5 cmH20] 5 cmH20  INTAKE / OUTPUT: No intake/output data recorded.  PHYSICAL EXAMINATION: General:  Young white female, found to be intubated and on ventilator Neuro:  obtunded HEENT:  Pupils+3 reactive and equal Cardiovascular: tachycardic, S1S2, no MRG noted Lungs:  Clear bilaterally, no wheezes, crackles,rhonchi noted Abdomen:  Soft, non tender, active bowel sounds Musculoskeletal:  No inflammation/deformity noted Skin:  Grossly  intact  LABS:  BMET  Recent Labs Lab 03/06/16 0133  NA 140  K 3.8  CL 108  CO2 24  BUN 13  CREATININE 0.95  GLUCOSE 107*    Electrolytes  Recent Labs Lab 03/06/16 0133  CALCIUM 8.5*    CBC  Recent Labs Lab 03/06/16 0133  WBC 10.0  HGB 12.8  HCT 37.5  PLT 273    Coag's No results for input(s): APTT, INR in the last 168 hours.  Sepsis Markers No results for input(s): LATICACIDVEN, PROCALCITON, O2SATVEN in the last 168 hours.  ABG  Recent Labs Lab 03/06/16 0156  PHART 7.30*  PCO2ART 42  PO2ART 185*    Liver Enzymes No results for input(s): AST, ALT, ALKPHOS, BILITOT, ALBUMIN in the last 168 hours.  Cardiac Enzymes  Recent Labs Lab 03/06/16 0133  TROPONINI <0.03    Glucose No results for input(s): GLUCAP in the last 168 hours.  Imaging Dg Chest Port 1 View  Result Date: 03/06/2016 CLINICAL DATA:  Endotracheal tube placement. Patient unresponsive. Initial encounter. EXAM: PORTABLE CHEST 1 VIEW COMPARISON:  Chest radiograph performed 02/25/2016 FINDINGS: The patient's endotracheal tube is seen ending 5 cm above the carina. The lungs are well-aerated and clear. There is no evidence of focal opacification, pleural effusion or pneumothorax. The cardiomediastinal silhouette is borderline normal in size. No acute osseous abnormalities are seen. IMPRESSION: 1. Endotracheal tube seen ending 5 cm above the carina. 2. No acute cardiopulmonary process seen. Electronically Signed   By: Roanna RaiderJeffery  Chang M.D.   On: 03/06/2016 01:57     STUDIES:  03/06/16 CT head>> negative for any intracranial abnormality  CULTURES: none  ANTIBIOTICS: none  SIGNIFICANT EVENTS: 03/06/16>> Patient admitted to the ICU intubated due to drug overdose  LINES/TUBES: 03/06/16 ET tube>  DISCUSSION: 35 year old female in acute respiratory failure, intubated and on mechanical ventilation likely due to drug overdose.  ASSESSMENT / PLAN:  PULMONARY A: Acute respiratory  failure likely due to drug overdose Tobacco abuse P:   Extubated earlier this morning, appears to be doing well from respiratory standpoint. Nicotine patch UDS positive for benzos and barbiturates F/u pregnancy test  CARDIOVASCULAR A:  Syncopal episode, uncertain etiology.  --Urine tox positive for benzos/barbiturates but not opioids. --Possible orthostatic hypotension.   P:  Will check echocardiogram. Check orthostatic BP.  Continuous telemetry Keep MAP goals>65 F/u ECHO RENAL A:   No active issues P:   Follow BMET intermittently Replace electrolytes per ICU protocol  GASTROINTESTINAL A:   No active issues P:   Famotidine for GIP  HEMATOLOGIC A:   No active issues P:  Heparin for dvt prophylaxis  INFECTIOUS A:   No active issues P:   Monitor fever curve CBC intermittently  ENDOCRINE A:   No active issues P:   Bood glucose checks with BMP intermittenly  NEUROLOGIC A:   Hx of polysubstance abuse P:   RASS goal: 0  to -1 Psch consulted F/U CT head UDS positive for benzos and barbiturates Will obtain acetaminophen level  FAMILY  - Updates:  No family present at the bedside    Bincy Varughese,AG-ACNP Pulmonary and Critical Care Medicine [facility] Pager: [phone]  03/06/2016, 2:17 AM  Patient seen and examined with the NP, agree with assessment, plan, physical findings as stated above and amended as needed. Patient presented with a syncopal episode of uncertain etiology, her new tox was positive for benzos and barbiturates, will workup further.  Wells Guiles, M.D.  03/06/2016  Critical Care Attestation.  I have personally obtained a history, examined the patient, evaluated laboratory and imaging results, formulated the assessment and plan and placed orders. The Patient requires high complexity decision making for assessment and support, frequent evaluation and titration of therapies, application of advanced monitoring  technologies and extensive interpretation of multiple databases. The patient has critical illness that could lead imminently to failure of 1 or more organ systems and requires the highest level of physician preparedness to intervene.  Critical Care Time devoted to patient care services described in this note is 35 minutes and is exclusive of time spent in procedures.

## 2016-03-06 NOTE — Progress Notes (Addendum)
  Patient is alert,oiented, follows command. Stable enough to be extubated therefore extubated at 04:40 am.  Patient is stable and maintaining O2 sats well.  Will continue to monitor.    Bincy Varughese,AG-ACNP Pulmonary & Critical Care  Discussed with Dr. Amado CoeGouru for transfer of service.  Nicholos Johns-Aileena Iglesia.  03/06/2016

## 2016-03-06 NOTE — ED Provider Notes (Signed)
[facility] Emergency Department Provider Note    First MD Initiated Contact with Patient 03/06/16 0114     (approximate)  I have reviewed the triage vital signs and the nursing notes.  History Limited secondary to unresponsiveness HISTORY  Chief Complaint Drug Overdose    HPI Sandy Becker is a 35 y.o. female presents via EMS unresponsive. Per EMS patient's family called secondary to patient being unresponsive. Patient presents to the emergency department not responding to verbal or painful stimuli. Per the patient's family she went outside to smoke a cigarette and then on return into the home called for her mother when they responded she was actively leaning against a wall and falling. Patient's family stated that the caught her and lowered her to the floor at which point he noted that she was unresponsive to verbal and painful stimuli. On arrival to the emergency department patient again unresponsive with no gag reflex.   No past medical history on file.  Patient Active Problem List   Diagnosis Date Noted  . Drug overdose 03/06/2016    Past Surgical History:  Procedure Laterality Date  . ABDOMINAL HYSTERECTOMY     partial  . CHOLECYSTECTOMY      Prior to Admission medications   Medication Sig Start Date End Date Taking? Authorizing Provider  HYDROcodone-acetaminophen (NORCO) 5-325 MG tablet Take 1 tablet by mouth every 6 (six) hours as needed. 02/15/16   Jenise V Bacon Menshew, PA-C  loratadine (CLARITIN) 10 MG tablet Take 1 tablet (10 mg total) by mouth daily. 02/25/16   Jami L Hagler, PA-C  naproxen (NAPROSYN) 500 MG tablet Take 1 tablet (500 mg total) by mouth 2 (two) times daily with a meal. 02/25/16   Jami L Hagler, PA-C  omeprazole (PRILOSEC) 40 MG capsule Take 1 capsule (40 mg total) by mouth daily. 02/20/16 02/19/17  Anne-Caroline Sharma CovertNorman, MD  orphenadrine (NORFLEX) 100 MG tablet Take 1 tablet (100 mg total) by mouth 2 (two) times daily as  needed for muscle spasms. 02/15/16   Jenise V Bacon Menshew, PA-C  predniSONE (DELTASONE) 10 MG tablet Take 1 tablet (10 mg total) by mouth 2 (two) times daily with a meal. 02/15/16   Jenise V Bacon Menshew, PA-C  promethazine-dextromethorphan (PROMETHAZINE-DM) 6.25-15 MG/5ML syrup Take 5 mLs by mouth 4 (four) times daily as needed for cough. 02/25/16   Jami L Hagler, PA-C    Allergies Prednisone; Red dye; Toradol [ketorolac tromethamine]; and Tramadol  No family history on file.  Social History Social History  Substance Use Topics  . Smoking status: Current Every Day Smoker    Packs/day: 0.50    Types: Cigarettes  . Smokeless tobacco: Never Used  . Alcohol use No    Review of Systems Obtained per family Constitutional: No fever/chills Eyes: No visual changes. ENT: No sore throat. Cardiovascular: Denies chest pain. Respiratory: Denies shortness of breath. Gastrointestinal: No abdominal pain.  No nausea, no vomiting.  No diarrhea.  No constipation. Genitourinary: Negative for dysuria. Musculoskeletal: Negative for back pain. Skin: Negative for rash. Neurological: Negative for headaches, focal weakness or numbness.  10-point ROS otherwise negative.  ____________________________________________   PHYSICAL EXAM:  VITAL SIGNS: ED Triage Vitals  Enc Vitals Group     BP 03/06/16 0121 100/85     Pulse Rate 03/06/16 0121 87     Resp 03/06/16 0121 14     Temp --      Temp src --      SpO2 03/06/16 0115 100 %  Weight 03/06/16 0123 220 lb (99.8 kg)     Height --      Head Circumference --      Peak Flow --      Pain Score --      Pain Loc --      Pain Edu? --      Excl. in GC? --     Constitutional: Unresponsive to verbal and painful stimuli Eyes: Conjunctivae are normal. Pupils pinpoint and unresponsive Head: Atraumatic. Ears:  Healthy appearing ear canals and TMs bilaterally Nose: No congestion/rhinnorhea. Mouth/Throat: Mucous membranes are moist.  Oropharynx  non-erythematous.Absent gag reflex Neck: No stridor.  No meningeal signs.  No cervical spine tenderness to palpation Cardiovascular: Normal rate, regular rhythm. Good peripheral circulation. Grossly normal heart sounds. Respiratory: Normal respiratory effort.  No retractions. Lungs CTAB. Gastrointestinal: Soft and nontender. No distention.   Musculoskeletal: No lower extremity tenderness nor edema. No gross deformities of extremities. Neurologic:  No response to verbal or noxious stimuli. Skin:  Skin is warm, dry and intact. No rash noted.   ____________________________________________   LABS (all labs ordered are listed, but only abnormal results are displayed)  Labs Reviewed  BASIC METABOLIC PANEL - Abnormal; Notable for the following:       Result Value   Glucose, Bld 107 (*)    Calcium 8.5 (*)    All other components within normal limits  URINE DRUG SCREEN, QUALITATIVE ([facility] ONLY) - Abnormal; Notable for the following:    Barbiturates, Ur Screen POSITIVE (*)    Benzodiazepine, Ur Scrn POSITIVE (*)    All other components within normal limits  BLOOD GAS, ARTERIAL - Abnormal; Notable for the following:    pH, Arterial 7.30 (*)    pO2, Arterial 185 (*)    Acid-base deficit 5.5 (*)    All other components within normal limits  CBC  TROPONIN I  SALICYLATE LEVEL  ACETAMINOPHEN LEVEL  PREGNANCY, URINE   ____________________________________________  EKG  ED ECG REPORT I, Gibbon N Juliet Vasbinder, the attending physician, personally viewed and interpreted this ECG.   Date: 03/06/2016  EKG Time: 1:29 AM  Rate: 102  Rhythm: Sinus tachycardia  Axis: Normal  Intervals: Normal  ST&T Change: None  ____________________________________________  RADIOLOGY I, Kaleva Dewayne ShorterN Karigan Cloninger, personally viewed and evaluated these images (plain radiographs) as part of my medical decision making, as well as reviewing the written report by the radiologist.  Dg Chest Port 1 View  Result Date:  03/06/2016 CLINICAL DATA:  Endotracheal tube placement. Patient unresponsive. Initial encounter. EXAM: PORTABLE CHEST 1 VIEW COMPARISON:  Chest radiograph performed 02/25/2016 FINDINGS: The patient's endotracheal tube is seen ending 5 cm above the carina. The lungs are well-aerated and clear. There is no evidence of focal opacification, pleural effusion or pneumothorax. The cardiomediastinal silhouette is borderline normal in size. No acute osseous abnormalities are seen. IMPRESSION: 1. Endotracheal tube seen ending 5 cm above the carina. 2. No acute cardiopulmonary process seen. Electronically Signed   By: Roanna RaiderJeffery  Chang M.D.   On: 03/06/2016 01:57     Procedure Name: Intubation Date/Time: 03/06/2016 3:22 AM Performed by: Darci CurrentBROWN, Keyser N Pre-anesthesia Checklist: Patient identified, Patient being monitored, Emergency Drugs available, Suction available and Timeout performed Oxygen Delivery Method: Ambu bag Preoxygenation: Pre-oxygenation with 100% oxygen Intubation Type: Rapid sequence, Cricoid Pressure applied and IV induction Ventilation: Oral airway inserted - appropriate to patient size Laryngoscope Size: Mac and 4 Grade View: Grade II Tube size: 7.5 mm Number of attempts: 1 Placement Confirmation: ETT  inserted through vocal cords under direct vision,  Positive ETCO2,  CO2 detector and Breath sounds checked- equal and bilateral Secured at: 21 cm Dental Injury: Teeth and Oropharynx as per pre-operative assessment  Difficulty Due To: Difficulty was anticipated        Critical Care performed: CRITICAL CARE Performed by: Darci Current   Total critical care time: 45 minutes  Critical care time was exclusive of separately billable procedures and treating other patients.  Critical care was necessary to treat or prevent imminent or life-threatening deterioration.  Critical care was time spent personally by me on the following activities: development of treatment plan with  patient and/or surrogate as well as nursing, discussions with consultants, evaluation of patient's response to treatment, examination of patient, obtaining history from patient or surrogate, ordering and performing treatments and interventions, ordering and review of laboratory studies, ordering and review of radiographic studies, pulse oximetry and re-evaluation of patient's condition.  ____________________________________________   INITIAL IMPRESSION / ASSESSMENT AND PLAN / ED COURSE  Pertinent labs & imaging results that were available during my care of the patient were reviewed by me and considered in my medical decision making (see chart for details).  Patient discussed with intensivist for hospital admission for further evaluation and management.   Clinical Course     ____________________________________________  FINAL CLINICAL IMPRESSION(S) / ED DIAGNOSES  Final diagnoses:  Drug overdose, undetermined intent, initial encounter     MEDICATIONS GIVEN DURING THIS VISIT:  Medications  0.9 %  sodium chloride infusion (not administered)  heparin injection 5,000 Units (not administered)  famotidine (PEPCID) IVPB 20 mg premix (not administered)  0.9 %  sodium chloride infusion ( Intravenous New Bag/Given 03/06/16 0245)  fentaNYL in NS (14mcg/ml) infusion-PREMIX (75 mcg/hr Intravenous Rate/Dose Change 03/06/16 0305)  fentaNYL (SUBLIMAZE) bolus via infusion 50 mcg (not administered)  midazolam (VERSED) injection 2 mg (not administered)  midazolam (VERSED) injection 2 mg (2 mg Intravenous Given 03/06/16 0257)  nicotine (NICODERM CQ - dosed in mg/24 hr) patch 7 mg (not administered)  succinylcholine (ANECTINE) injection (120 mg Intravenous Given 03/06/16 0123)  0.9 %  sodium chloride infusion (1,000 mLs Intravenous New Bag/Given 03/06/16 0126)  fentaNYL (SUBLIMAZE) injection 50 mcg (50 mcg Intravenous Given 03/06/16 0250)  etomidate (AMIDATE) injection 20 mg (20 mg  Intravenous Given 03/06/16 0226)  rocuronium (ZEMURON) injection 10.4 mg (10.4 mg Intravenous Given 03/06/16 0226)     NEW OUTPATIENT MEDICATIONS STARTED DURING THIS VISIT:  New Prescriptions   No medications on file    Modified Medications   No medications on file    Discontinued Medications   No medications on file     Note:  This document was prepared using Dragon voice recognition software and may include unintentional dictation errors.    Darci Current, MD 03/06/16 [id number]

## 2016-03-06 NOTE — ED Notes (Signed)
Pt placed on ventilator on Assist control.

## 2016-03-06 NOTE — Procedures (Signed)
Extubation Procedure Note  Patient Details:   Name: Sandy Becker DOB: [birth year] MRN: [id number]   Airway Documentation:  Airway 7.5 mm (Active)  Secured at (cm) 21 cm 03/06/2016  1:52 AM  Measured From Lips 03/06/2016  1:52 AM  Secured Location Left 03/06/2016  1:52 AM  Secured By [employer] Tube Holder 03/06/2016  1:52 AM  Cuff Pressure (cm H2O) 26 cm H2O 03/06/2016  1:52 AM  Site Condition Dry 03/06/2016  1:52 AM    Evaluation  O2 sats: stable throughout Complications: No apparent complications Patient did tolerate procedure well. Bilateral Breath Sounds: Clear   Yes   Patient extubated to 2 lpm nasal cannula with humidity. Able to talk and cough.  Sandy Becker, Sandy Becker 03/06/2016, 4:42 AM
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
## 2016-03-06 NOTE — Progress Notes (Signed)
Key Points: Use following P&T approved IV to PO antibiotic change policy.  Description contains the criteria that are approved Note: Policy Excludes:  Esophagectomy patientsPHARMACIST - PHYSICIAN COMMUNICATION DR:   Nicholos Johnsamachandran CONCERNING: IV to Oral Route Change Policy  RECOMMENDATION: This patient is receiving famotidine 20 mg q12h by the intravenous route.  Based on criteria approved by the Pharmacy and Therapeutics Committee, the intravenous medication(s) is/are being converted to the equivalent oral dose form(s).   DESCRIPTION: These criteria include:  The patient is eating (either orally or via tube) and/or has been taking other orally administered medications for a least 24 hours  The patient has no evidence of active gastrointestinal bleeding or impaired GI absorption (gastrectomy, short bowel, patient on TNA or NPO).  If you have questions about this conversion, please contact the Pharmacy Department   Horris LatinoHolly Gilliam, PharmD Pharmacy Resident 03/06/2016 8:47 AM

## 2016-03-06 NOTE — ED Notes (Signed)
Pt returned from CT °

## 2016-03-07 LAB — COMPREHENSIVE METABOLIC PANEL
ALBUMIN: 2.8 g/dL — AB (ref 3.5–5.0)
ALT: 13 U/L — ABNORMAL LOW (ref 14–54)
AST: 14 U/L — AB (ref 15–41)
Alkaline Phosphatase: 63 U/L (ref 38–126)
Anion gap: 6 (ref 5–15)
BUN: 11 mg/dL (ref 6–20)
CHLORIDE: 111 mmol/L (ref 101–111)
CO2: 22 mmol/L (ref 22–32)
Calcium: 8 mg/dL — ABNORMAL LOW (ref 8.9–10.3)
Creatinine, Ser: 0.64 mg/dL (ref 0.44–1.00)
GFR calc Af Amer: >60 mL/min (ref >60)
GFR calc non Af Amer: >60 mL/min (ref >60)
GLUCOSE: 94 mg/dL (ref 65–99)
POTASSIUM: 3.4 mmol/L — AB (ref 3.5–5.1)
SODIUM: 139 mmol/L (ref 135–145)
Total Bilirubin: 0.4 mg/dL (ref 0.3–1.2)
Total Protein: 5.7 g/dL — ABNORMAL LOW (ref 6.5–8.1)

## 2016-03-07 LAB — PHOSPHORUS: PHOSPHORUS: 2.8 mg/dL (ref 2.5–4.6)

## 2016-03-07 LAB — MAGNESIUM: Magnesium: 1.7 mg/dL (ref 1.7–2.4)

## 2016-03-07 MED ORDER — IBUPROFEN 400 MG PO TABS
800.0000 mg | ORAL_TABLET | Freq: Four times a day (QID) | ORAL | Status: DC | PRN
  Administered 2016-03-07: 800 mg via ORAL
  Filled 2016-03-07: qty 2

## 2016-03-07 NOTE — Discharge Instructions (Signed)
[facility] [place] at [facility] ° °DIET:  °Regular diet ° °DISCHARGE CONDITION:  °Stable ° °ACTIVITY:  °Activity as tolerated ° °OXYGEN:  °Home Oxygen: No. °  °Oxygen Delivery: room air ° °DISCHARGE LOCATION:  °home  ° ° °ADDITIONAL DISCHARGE INSTRUCTION: ° ° °If you experience worsening of your admission symptoms, develop shortness of breath, life threatening emergency, suicidal or homicidal thoughts you must seek medical attention immediately by calling 911 or calling your MD immediately  if symptoms less severe. ° °You Must read complete instructions/literature along with all the possible adverse reactions/side effects for all the Medicines you take and that have been prescribed to you. Take any new Medicines after you have completely understood and accpet all the possible adverse reactions/side effects.  ° °Please note ° °You were cared for by a hospitalist during your hospital stay. If you have any questions about your discharge medications or the care you received while you were in the hospital after you are discharged, you can call the unit and asked to speak with the hospitalist on call if the hospitalist that took care of you is not available. Once you are discharged, your primary care physician will handle any further medical issues. Please note that NO REFILLS for any discharge medications will be authorized once you are discharged, as it is imperative that you return to your primary care physician (or establish a relationship with a primary care physician if you do not have one) for your aftercare needs so that they can reassess your need for medications and monitor your lab values. ° ° °

## 2016-03-07 NOTE — Progress Notes (Signed)
Pt d/c to home today.  IV removed intact.  Rx's given to pt w/all questions and concerns addressed.  D/C paperwork reviewed and education provided with all questions and concerns addressed.  Pt family  bedside for home transport. Pt escorted to exit via wheelchair.

## 2016-03-07 NOTE — Discharge Summary (Signed)
[facility] - Lead at [facility]  [address]Sandy Becker, [place]35 y.o., DOB 21-Jun-1980, MRN [id number]. Admission date: 03/06/2016 Discharge Date 03/07/2016 Primary MD No PCP Per Patient Admitting Physician Shane CrutchPradeep Ramachandran, MD  Admission Diagnosis  Drug overdose, undetermined intent, initial encounter [T50.904A]  Discharge Diagnosis   Principal Problem:   Overdose of sedative or hypnotic   Drug overdose             Hospital Course  Sandy Becker is a 35 yo female with Hx of drug abuse. Patient went out to smoke a cigarette and was found unresponsive.  Family members called the EMS.  When the EMS arrived on the scene patient had stable vital signs.  [facility] where she was noted to be very somnolent and therefore was intubated for airway protection.  PCCM team was called to admit the patient. Patient was admitted by the intensivist. Her mental status improved after being admitted to the ICU. She was seen by psychiatry who did not feel the patient had intentional drug overdose. Her urine drug screen did show benzos and barbiturates. Patient currently doing well and is asymptomatic. And is stable for discharge and follow-up with her primary care provider.             Consults  psychiatry  Significant Tests:  See full reports for all details     Dg Chest 2 View  Result Date: 02/25/2016 CLINICAL DATA:  Shortness of breath. EXAM: CHEST  2 VIEW COMPARISON:  02/20/2016 FINDINGS: The heart size and mediastinal contours are within normal limits. There is no evidence of pulmonary edema, consolidation, pneumothorax, nodule or pleural fluid. The visualized skeletal structures are unremarkable. IMPRESSION: No active cardiopulmonary disease. Electronically Signed   By: Irish LackGlenn  Yamagata M.D.   On: 02/25/2016 14:02   Dg Chest 2 View  Result Date: 02/20/2016 CLINICAL DATA:  Difficulty swallowing and unable the past any oral intake EXAM: CHEST  2 VIEW  COMPARISON:  None. FINDINGS: Cardiac shadow is within normal limits. The lungs are well aerated bilaterally. No significant dilatation of the esophagus is noted. No bony abnormality is seen. IMPRESSION: No active cardiopulmonary disease. Electronically Signed   By: Alcide CleverMark  Lukens M.D.   On: 02/20/2016 15:00   Ct Head Wo Contrast  Result Date: 03/06/2016 CLINICAL DATA:  Syncope. Stable vital signs but unresponsive. History of drug use and overdose. EXAM: CT HEAD WITHOUT CONTRAST TECHNIQUE: Contiguous axial images were obtained from the base of the skull through the vertex without intravenous contrast. COMPARISON:  None. FINDINGS: Brain: No evidence of acute infarction, hemorrhage, hydrocephalus, extra-axial collection or mass lesion/mass effect. Vascular: No hyperdense vessel or unexpected calcification. Skull: Normal. Negative for fracture or focal lesion. Sinuses/Orbits: Mucosal thickening in the paranasal sinuses. Mastoid air cells are not opacified. Other: None. IMPRESSION: No acute intracranial abnormalities. Electronically Signed   By: Burman NievesWilliam  Stevens M.D.   On: 03/06/2016 03:47   Dg Chest Port 1 View  Result Date: 03/06/2016 CLINICAL DATA:  Endotracheal tube placement. Patient unresponsive. Initial encounter. EXAM: PORTABLE CHEST 1 VIEW COMPARISON:  Chest radiograph performed 02/25/2016 FINDINGS: The patient's endotracheal tube is seen ending 5 cm above the carina. The lungs are well-aerated and clear. There is no evidence of focal opacification, pleural effusion or pneumothorax. The cardiomediastinal silhouette is borderline normal in size. No acute osseous abnormalities are seen. IMPRESSION: 1. Endotracheal tube seen ending 5 cm above the carina. 2. No acute cardiopulmonary process seen. Electronically Signed   By: Leotis ShamesJeffery  Chang M.D.   On: 03/06/2016 01:57       Today   Subjective:   Sandy Becker  complains of being sore. It has been going to the bathroom.  Objective:   Blood pressure  114/76, pulse 80, temperature 98.6 F (37 C), temperature source Oral, resp. rate 16, height 5\' 6"  (1.676 m), weight 220 lb 0.3 oz (99.8 kg), SpO2 96 %.  .  Intake/Output Summary (Last 24 hours) at 03/07/16 1220 Last data filed at 03/07/16 0800  Gross per 24 hour  Intake             1210 ml  Output                0 ml  Net             1210 ml    Exam VITAL SIGNS: Blood pressure 114/76, pulse 80, temperature 98.6 F (37 C), temperature source Oral, resp. rate 16, height 5\' 6"  (1.676 m), weight 220 lb 0.3 oz (99.8 kg), SpO2 96 %.  GENERAL:  35 y.o.-year-old patient lying in the bed with no acute distress.  EYES: Pupils equal, round, reactive to light and accommodation. No scleral icterus. Extraocular muscles intact.  HEENT: Head atraumatic, normocephalic. Oropharynx and nasopharynx clear.  NECK:  Supple, no jugular venous distention. No thyroid enlargement, no tenderness.  LUNGS: Normal breath sounds bilaterally, no wheezing, rales,rhonchi or crepitation. No use of accessory muscles of respiration.  CARDIOVASCULAR: S1, S2 normal. No murmurs, rubs, or gallops.  ABDOMEN: Soft, nontender, nondistended. Bowel sounds present. No organomegaly or mass.  EXTREMITIES: No pedal edema, cyanosis, or clubbing.  NEUROLOGIC: Cranial nerves II through XII are intact. Muscle strength 5/5 in all extremities. Sensation intact. Gait not checked.  PSYCHIATRIC: The patient is alert and oriented x 3.  SKIN: No obvious rash, lesion, or ulcer.   Data Review     CBC w Diff: Lab Results  Component Value Date   WBC 10.0 03/06/2016   HGB 12.8 03/06/2016   HCT 37.5 03/06/2016   PLT 273 03/06/2016   CMP: Lab Results  Component Value Date   NA 139 03/07/2016   K 3.4 (L) 03/07/2016   CL 111 03/07/2016   CO2 22 03/07/2016   BUN 11 03/07/2016   CREATININE 0.64 03/07/2016   PROT 5.7 (L) 03/07/2016   ALBUMIN 2.8 (L) 03/07/2016   BILITOT 0.4 03/07/2016   ALKPHOS 63 03/07/2016   AST 14 (L) 03/07/2016   ALT  13 (L) 03/07/2016  .  Micro Results Recent Results (from the past 240 hour(s))  MRSA PCR Screening     Status: None   Collection Time: 03/06/16  4:20 AM  Result Value Ref Range Status   MRSA by PCR NEGATIVE NEGATIVE Final    Comment:        The GeneXpert MRSA Assay (FDA approved for NASAL specimens only), is one component of a comprehensive MRSA colonization surveillance program. It is not intended to diagnose MRSA infection nor to guide or monitor treatment for MRSA infections.         Code Status Orders        Start     Ordered   03/06/16 0212  Full code  Continuous     03/06/16 0214    Code Status History    Date Active Date Inactive Code Status Order ID Comments User Context   This patient has a current code status but no historical code status.  Follow-up Information    pcp Follow up in 1 week(s).           Discharge Medications     Medication List    TAKE these medications   HYDROcodone-acetaminophen 5-325 MG tablet Commonly known as:  NORCO Take 1 tablet by mouth every 6 (six) hours as needed.   loratadine 10 MG tablet Commonly known as:  CLARITIN Take 1 tablet (10 mg total) by mouth daily.   naproxen 500 MG tablet Commonly known as:  NAPROSYN Take 1 tablet (500 mg total) by mouth 2 (two) times daily with a meal.   omeprazole 40 MG capsule Commonly known as:  PRILOSEC Take 1 capsule (40 mg total) by mouth daily.   orphenadrine 100 MG tablet Commonly known as:  NORFLEX Take 1 tablet (100 mg total) by mouth 2 (two) times daily as needed for muscle spasms.   predniSONE 10 MG tablet Commonly known as:  DELTASONE Take 1 tablet (10 mg total) by mouth 2 (two) times daily with a meal.   promethazine-dextromethorphan 6.25-15 MG/5ML syrup Commonly known as:  PROMETHAZINE-DM Take 5 mLs by mouth 4 (four) times daily as needed for cough.          Total Time in preparing paper work, data evaluation and todays exam - 35  minutes  Auburn Bilberry M.D on 03/07/2016 at 12:20 PM  [facility]   Office  [phone]

## 2016-03-12 LAB — GLUCOSE, CAPILLARY: Glucose-Capillary: 83 mg/dL (ref 65–99)

## 2016-05-10 ENCOUNTER — Emergency Department: Payer: Medicaid - Out of State

## 2016-05-10 ENCOUNTER — Encounter: Payer: Self-pay | Admitting: Emergency Medicine

## 2016-05-10 DIAGNOSIS — R05 Cough: Secondary | ICD-10-CM | POA: Insufficient documentation

## 2016-05-10 DIAGNOSIS — Z79899 Other long term (current) drug therapy: Secondary | ICD-10-CM | POA: Insufficient documentation

## 2016-05-10 DIAGNOSIS — R0602 Shortness of breath: Secondary | ICD-10-CM | POA: Insufficient documentation

## 2016-05-10 DIAGNOSIS — R1110 Vomiting, unspecified: Secondary | ICD-10-CM | POA: Insufficient documentation

## 2016-05-10 DIAGNOSIS — Z791 Long term (current) use of non-steroidal anti-inflammatories (NSAID): Secondary | ICD-10-CM | POA: Insufficient documentation

## 2016-05-10 DIAGNOSIS — J449 Chronic obstructive pulmonary disease, unspecified: Secondary | ICD-10-CM | POA: Insufficient documentation

## 2016-05-10 DIAGNOSIS — F17210 Nicotine dependence, cigarettes, uncomplicated: Secondary | ICD-10-CM | POA: Insufficient documentation

## 2016-05-10 MED ORDER — ALBUTEROL SULFATE (2.5 MG/3ML) 0.083% IN NEBU
5.0000 mg | INHALATION_SOLUTION | Freq: Once | RESPIRATORY_TRACT | Status: AC
  Administered 2016-05-10: 5 mg via RESPIRATORY_TRACT
  Filled 2016-05-10: qty 6

## 2016-05-10 MED ORDER — ALBUTEROL SULFATE (2.5 MG/3ML) 0.083% IN NEBU
INHALATION_SOLUTION | RESPIRATORY_TRACT | Status: DC
Start: 2016-05-10 — End: 2016-05-11
  Filled 2016-05-10: qty 3

## 2016-05-10 NOTE — ED Triage Notes (Signed)
Pt states that she had a cough x1 week and it is causing her to have chest pain and N/V. Pt has hx of COPD and earlier today experienced LOC and hit her head on the toilet.

## 2016-05-11 ENCOUNTER — Emergency Department
Admission: EM | Admit: 2016-05-11 | Discharge: 2016-05-11 | Disposition: A | Discharge status: Home / Self Care | Payer: Medicaid - Out of State | Attending: Emergency Medicine | Admitting: Emergency Medicine

## 2016-05-11 DIAGNOSIS — R059 Cough, unspecified: Secondary | ICD-10-CM

## 2016-05-11 DIAGNOSIS — R05 Cough: Secondary | ICD-10-CM

## 2016-05-11 HISTORY — DX: Chronic obstructive pulmonary disease, unspecified: J44.9

## 2016-05-11 LAB — BASIC METABOLIC PANEL
Anion gap: 8 (ref 5–15)
BUN: 12 mg/dL (ref 6–20)
CHLORIDE: 106 mmol/L (ref 101–111)
CO2: 23 mmol/L (ref 22–32)
Calcium: 8.8 mg/dL — ABNORMAL LOW (ref 8.9–10.3)
Creatinine, Ser: 0.67 mg/dL (ref 0.44–1.00)
GFR calc Af Amer: >60 mL/min (ref >60)
GFR calc non Af Amer: >60 mL/min (ref >60)
GLUCOSE: 116 mg/dL — AB (ref 65–99)
POTASSIUM: 3.1 mmol/L — AB (ref 3.5–5.1)
Sodium: 137 mmol/L (ref 135–145)

## 2016-05-11 LAB — CBC
HEMATOCRIT: 37.1 % (ref 35.0–47.0)
Hemoglobin: 12.9 g/dL (ref 12.0–16.0)
MCH: 29.6 pg (ref 26.0–34.0)
MCHC: 34.7 g/dL (ref 32.0–36.0)
MCV: 85.4 fL (ref 80.0–100.0)
Platelets: 368 10*3/uL (ref 150–440)
RBC: 4.35 MIL/uL (ref 3.80–5.20)
RDW: 13 % (ref 11.5–14.5)
WBC: 13.7 10*3/uL — ABNORMAL HIGH (ref 3.6–11.0)

## 2016-05-11 MED ORDER — BENZONATATE 100 MG PO CAPS: 100.0000 mg | ORAL_CAPSULE | Freq: Four times a day (QID) | ORAL | 0 refills | Status: AC | PRN

## 2016-05-11 MED ORDER — ACETAMINOPHEN 325 MG PO TABS
650.0000 mg | ORAL_TABLET | Freq: Once | ORAL | Status: AC
  Administered 2016-05-11: 650 mg via ORAL
  Filled 2016-05-11: qty 2

## 2016-05-11 MED ORDER — IPRATROPIUM-ALBUTEROL 0.5-2.5 (3) MG/3ML IN SOLN
3.0000 mL | Freq: Once | RESPIRATORY_TRACT | Status: AC
  Administered 2016-05-11: 3 mL via RESPIRATORY_TRACT
  Filled 2016-05-11: qty 3

## 2016-05-11 MED ORDER — BENZONATATE 100 MG PO CAPS
100.0000 mg | ORAL_CAPSULE | Freq: Once | ORAL | Status: AC
  Administered 2016-05-11: 100 mg via ORAL

## 2016-05-11 MED ORDER — BENZONATATE 100 MG PO CAPS
ORAL_CAPSULE | ORAL | Status: AC
  Administered 2016-05-11: 100 mg via ORAL
  Filled 2016-05-11: qty 1

## 2016-05-11 NOTE — ED Notes (Signed)
Patient reports LOC while vomiting today. Pt reports she hit right lateral head on the toilet. Fall unwitnessed

## 2016-05-11 NOTE — Discharge Instructions (Signed)
Please seek medical attention for any high fevers, chest pain, shortness of breath, change in behavior, persistent vomiting, bloody stool or any other new or concerning symptoms.  

## 2016-05-11 NOTE — ED Provider Notes (Signed)
[facility] Emergency Department Provider Note  ____________________________________________   I have reviewed the triage vital signs and the nursing notes.   HISTORY  Chief Complaint Cough and Shortness of Breath   History limited by: Not Limited   HPI Sandy Becker is a 36 y.o. female who presents to the emergency department today because of concerns for cough and shortness of breath. She states that the symptoms been going on for the past 2 days. She states they are severe. She states that she starts coughing so much that she develops vomiting. She states that this is not a new symptom. She has had bad coughing on and off since November. States that she did try her albuterol inhaler at home without great relief. She denies any fevers.   Past Medical History:  Diagnosis Date  . COPD (chronic obstructive pulmonary disease) [facility])     Patient Active Problem List   Diagnosis Date Noted  . Drug overdose 03/06/2016  . 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 Overdose of sedative or hypnotic 03/06/2016    Past Surgical History:  Procedure Laterality Date  . ABDOMINAL HYSTERECTOMY     partial  . CHOLECYSTECTOMY      Prior to Admission medications   Medication Sig Start Date End Date Taking? Authorizing Provider  HYDROcodone-acetaminophen (NORCO) 5-325 MG tablet Take 1 tablet by mouth every 6 (six) hours as needed. 02/15/16   Jenise V Bacon Menshew, PA-C  loratadine (CLARITIN) 10 MG tablet Take 1 tablet (10 mg total) by mouth daily. 02/25/16   Jami L Hagler, PA-C  naproxen (NAPROSYN) 500 MG tablet Take 1 tablet (500 mg total) by mouth 2 (two) times daily with a meal. 02/25/16   Jami L Hagler, PA-C  omeprazole (PRILOSEC) 40 MG capsule Take 1 capsule (40 mg total) by mouth daily. 02/20/16 02/19/17  Anne-Caroline Sharma Covert, MD  orphenadrine (NORFLEX) 100 MG tablet Take 1 tablet (100 mg total) by mouth 2 (two) times daily as needed for  muscle spasms. 02/15/16   Jenise V Bacon Menshew, PA-C  predniSONE (DELTASONE) 10 MG tablet Take 1 tablet (10 mg total) by mouth 2 (two) times daily with a meal. 02/15/16   Jenise V Bacon Menshew, PA-C  promethazine-dextromethorphan (PROMETHAZINE-DM) 6.25-15 MG/5ML syrup Take 5 mLs by mouth 4 (four) times daily as needed for cough. 02/25/16   Jami L Hagler, PA-C    Allergies Prednisone; Red dye; Toradol [ketorolac tromethamine]; and Tramadol  No family history on file.  Social History Social History  Substance Use Topics  . Smoking status: Current Every Day Smoker    Packs/day: 0.50    Types: Cigarettes  . Smokeless tobacco: Never Used  . Alcohol use No    Review of Systems  Constitutional: Negative for fever. Cardiovascular: Negative for chest pain. Respiratory: Positive for shortness of breath. Positive for cough. Gastrointestinal: Negative for abdominal pain. Positive for vomiting. Neurological: Negative for headaches, focal weakness or numbness.  10-point ROS otherwise negative.  ____________________________________________   PHYSICAL EXAM:  VITAL SIGNS: ED Triage Vitals  Enc Vitals Group     BP 05/10/16 2337 (!) 110/91     Pulse Rate 05/10/16 2337 98     Resp --      Temp 05/10/16 2337 99.7 F (37.6 C)     Temp Source 05/10/16 2337 Oral     SpO2 05/10/16 2337 95 %     Weight 05/10/16 2340 220 lb (99.8 kg)     Height 05/10/16  2340 5\' 5"  (1.651 m)   Constitutional: Alert and oriented. Well appearing and in no distress. Eyes: Conjunctivae are normal. Normal extraocular movements. ENT   Head: Normocephalic and atraumatic.   Nose: No congestion/rhinnorhea.   Mouth/Throat: Mucous membranes are moist.   Neck: No stridor. Hematological/Lymphatic/Immunilogical: No cervical lymphadenopathy. Cardiovascular: Normal rate, regular rhythm.  No murmurs, rubs, or gallops.  Respiratory: Normal respiratory effort without tachypnea nor retractions. Breath sounds  are clear and equal bilaterally. Occasional dry cough. Gastrointestinal: Soft and non tender. No rebound. No guarding.  Genitourinary: Deferred Musculoskeletal: Normal range of motion in all extremities. No lower extremity edema. Neurologic:  Normal speech and language. No gross focal neurologic deficits are appreciated.  Skin:  Skin is warm, dry and intact. No rash noted. Psychiatric: Mood and affect are normal. Speech and behavior are normal. Patient exhibits appropriate insight and judgment.  ____________________________________________    LABS (pertinent positives/negatives)  Labs Reviewed  BASIC METABOLIC PANEL - Abnormal; Notable for the following:       Result Value   Potassium 3.1 (*)    Glucose, Bld 116 (*)    Calcium 8.8 (*)    All other components within normal limits  CBC - Abnormal; Notable for the following:    WBC 13.7 (*)    All other components within normal limits     ____________________________________________   EKG  I, Phineas SemenGraydon Sarayah Bacchi, attending physician, personally viewed and interpreted this EKG  EKG Time: 2343 Rate: 92 Rhythm: normal sinus rhythm Axis: normal Intervals: qtc 422 QRS: narrow ST changes: no st elevation Impression: normal ekg  ____________________________________________   PROCEDURES  Procedures  ____________________________________________   INITIAL IMPRESSION / ASSESSMENT AND PLAN / ED COURSE  Pertinent labs & imaging results that were available during my care of the patient were reviewed by me and considered in my medical decision making (see chart for details).  Patient presented to the emergency department today because of concerns for shortness of breath and cough. This point I think likely COPD exacerbation. Unfortunately patient unable to take steroids secondary to allergy. She did get some relief with DuoNeb treatment here in the emergency department. Will discharge home with Tessalon Perles for cough. Didn't  start patient to follow-up with primary care.  ____________________________________________   FINAL CLINICAL IMPRESSION(S) / ED DIAGNOSES  Final diagnoses:  Cough     Note: This dictation was prepared with Dragon dictation. Any transcriptional errors that result from this process are unintentional     Phineas SemenGraydon Evaleen Sant, MD 05/11/16 [phone]

## 2018-08-16 IMAGING — CR DG CHEST 2V
1 series · 2 of 2 positions shown · non-contrast
Comparison: 02/20/2016

CLINICAL DATA: Shortness of breath.

EXAM:
CHEST  2 VIEW

[Series 1: dg chest 2 view · 0.14mm/px · 2 of 2 slices shown]
[im 1/2]
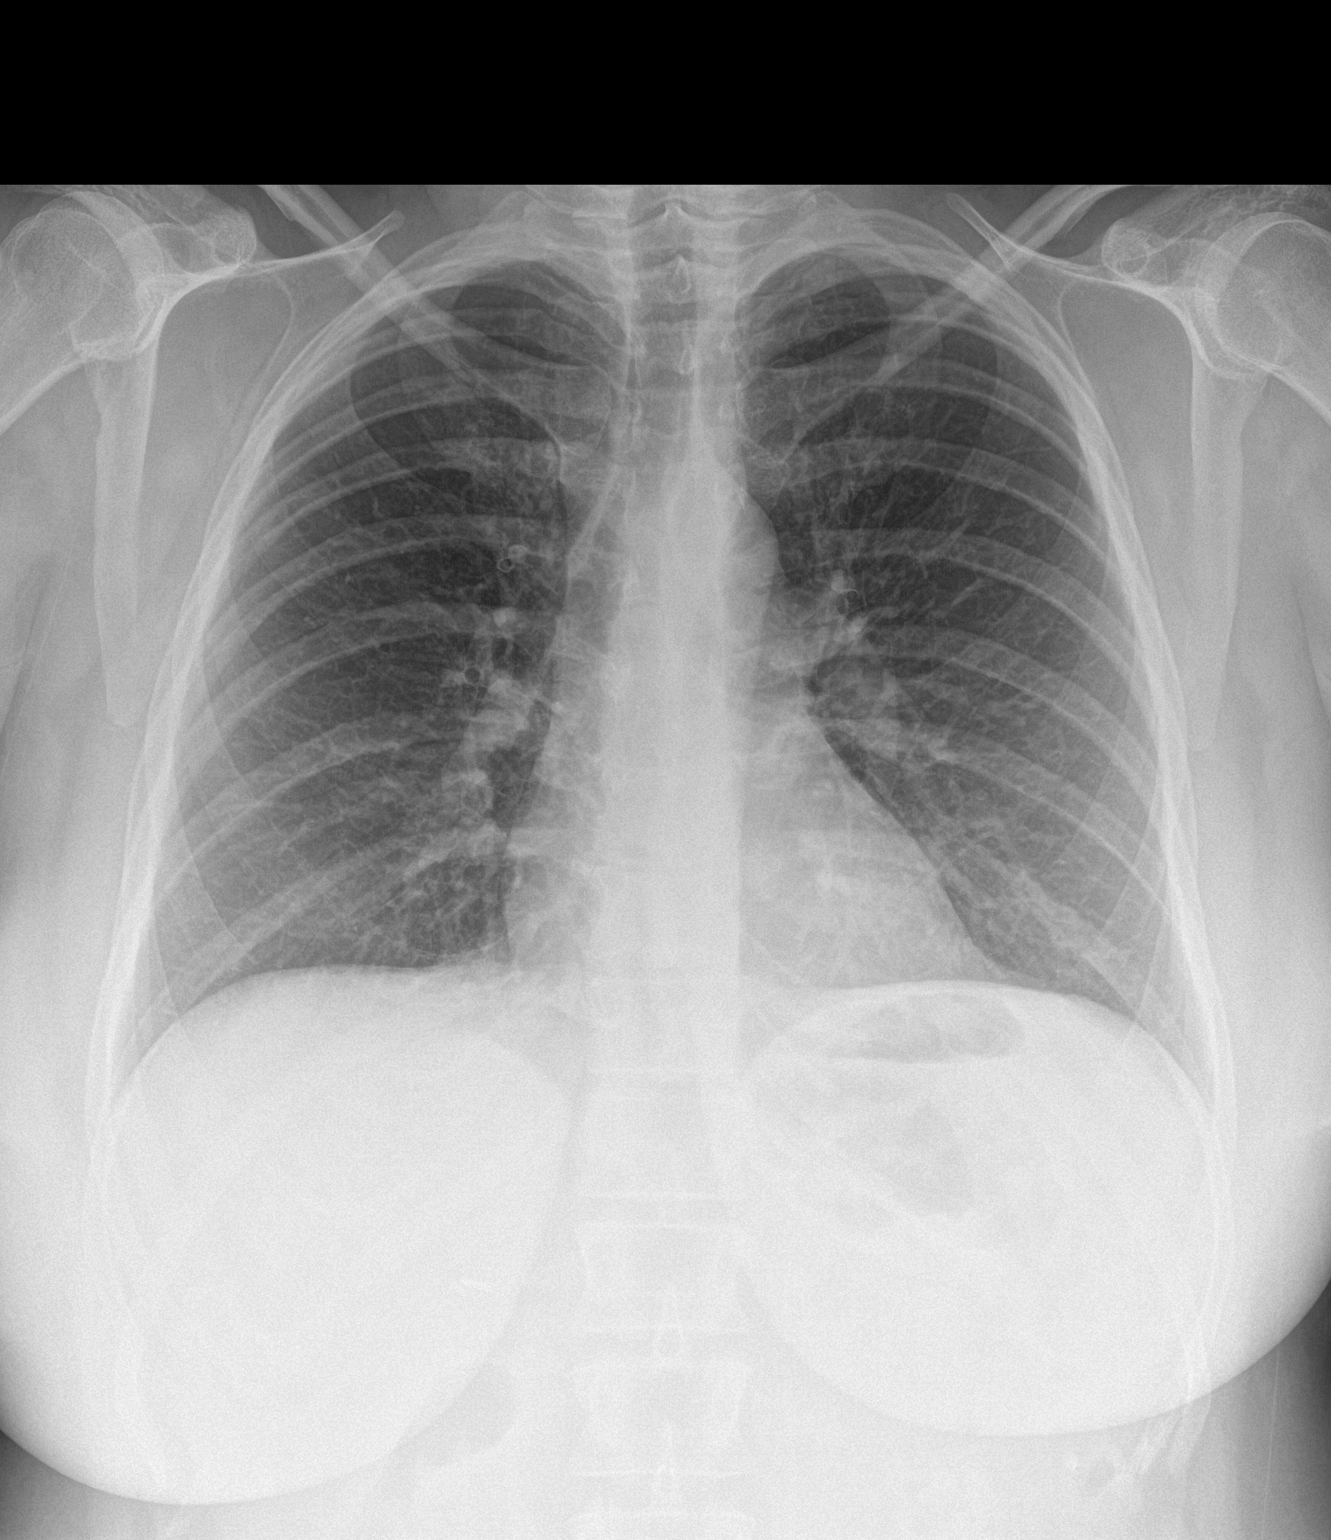
[im 2/2]
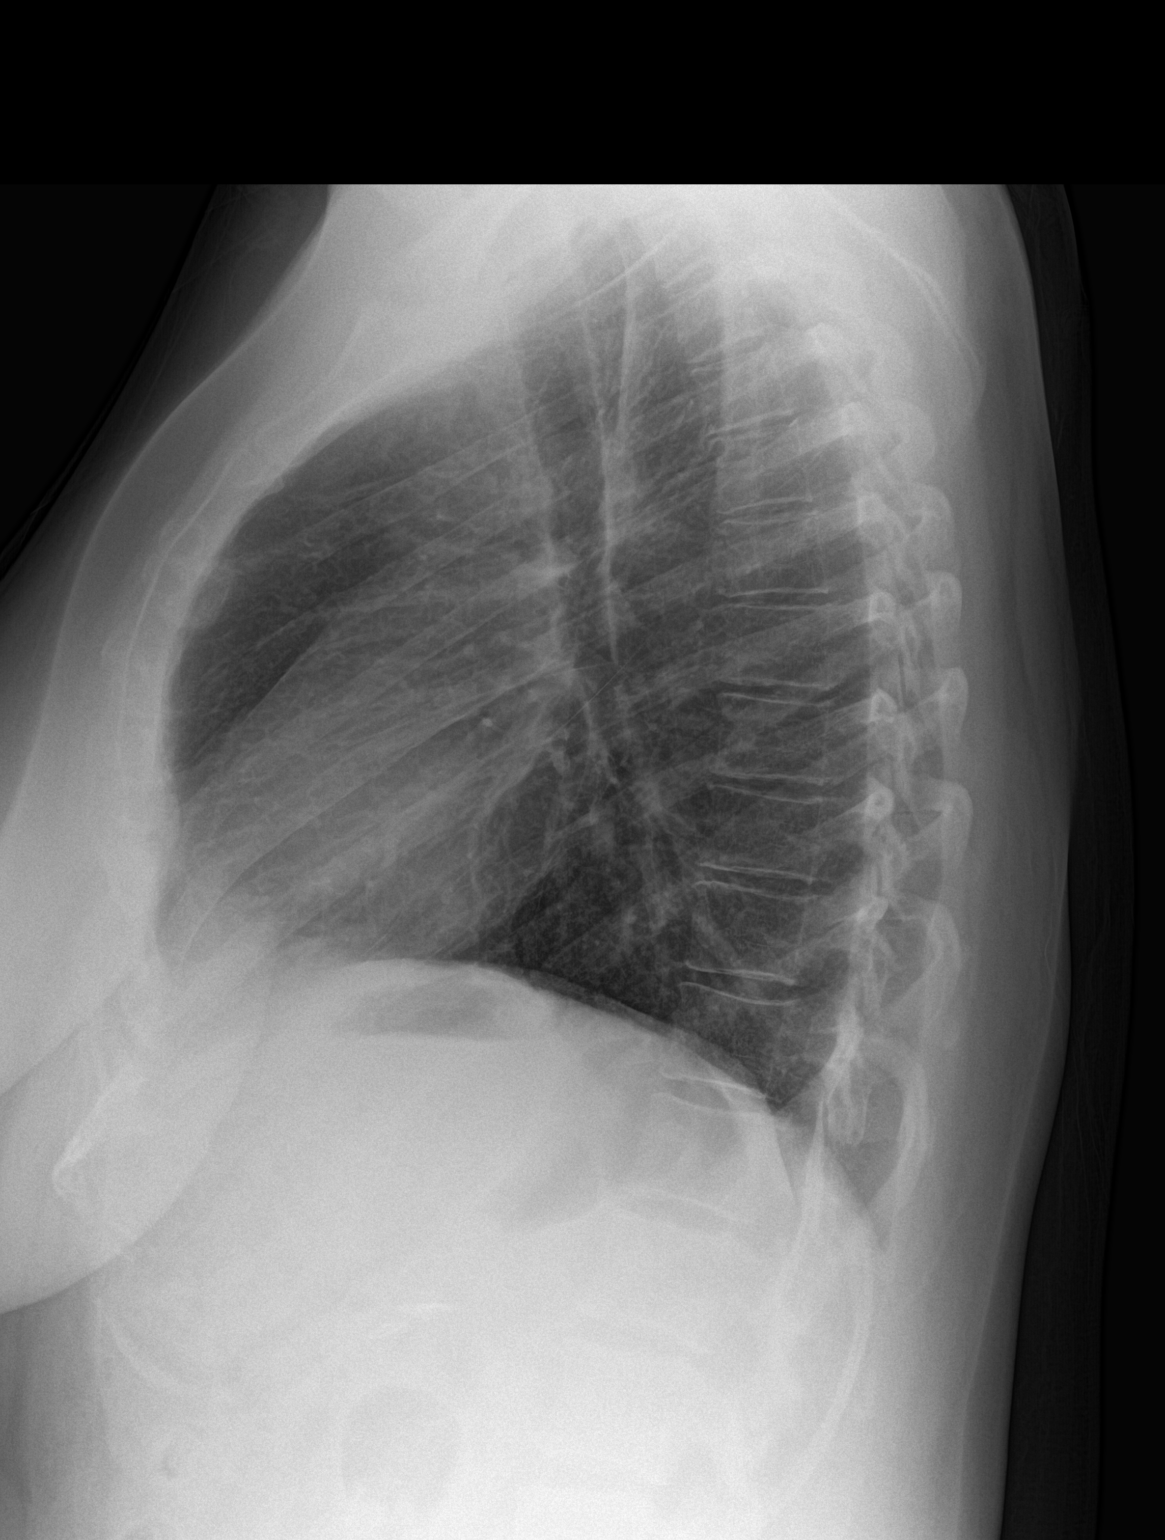

[2 of 2 positions shown; findings below may reference images not displayed]

FINDINGS: The heart size and mediastinal contours are within normal limits.
There is no evidence of pulmonary edema, consolidation,
pneumothorax, nodule or pleural fluid. The visualized skeletal
structures are unremarkable.
IMPRESSION: No active cardiopulmonary disease.

## 2018-08-26 IMAGING — CT CT HEAD W/O CM
3 series · 16 of 45 positions shown, 19 images · non-contrast
Comparison: None.

CLINICAL DATA: Syncope. Stable vital signs but unresponsive.
History of drug use and overdose.

EXAM:
CT HEAD WITHOUT CONTRAST
TECHNIQUE: Contiguous axial images were obtained from the base of the skull
through the vertex without intravenous contrast.

[Series 3: ax head wo · axial · 0.41mm/px · z∈[-96,+9]mm · 10 of 28 slices shown, 13 images]
[im 3/28  brain]
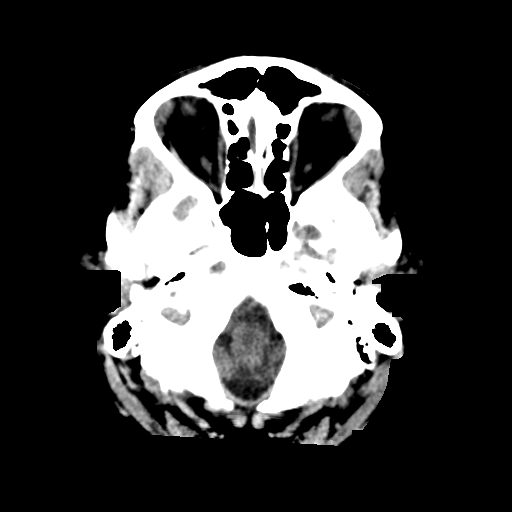
[im 3/28  bone]
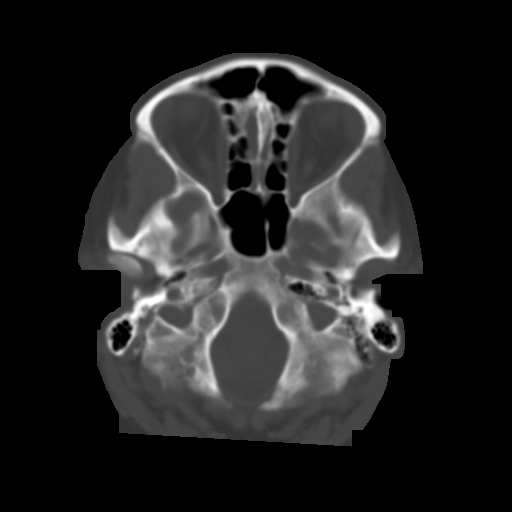
[im 5/28  brain]
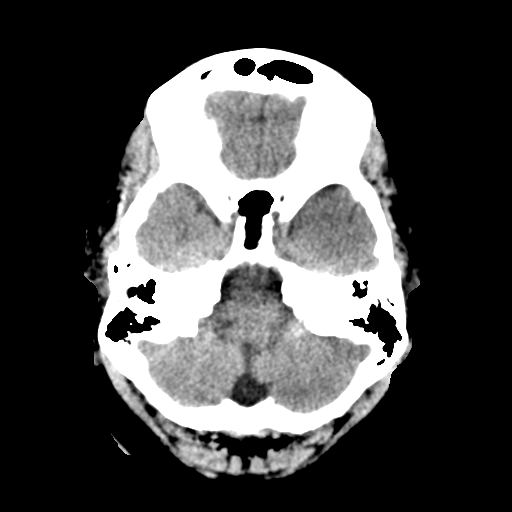
[im 8/28  brain]
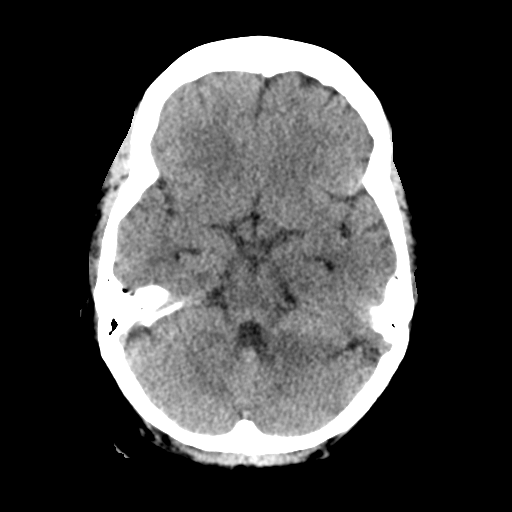
[im 11/28  brain]
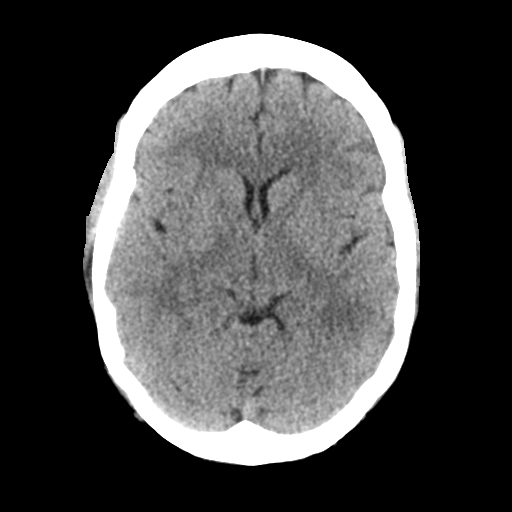
[im 13/28  brain]
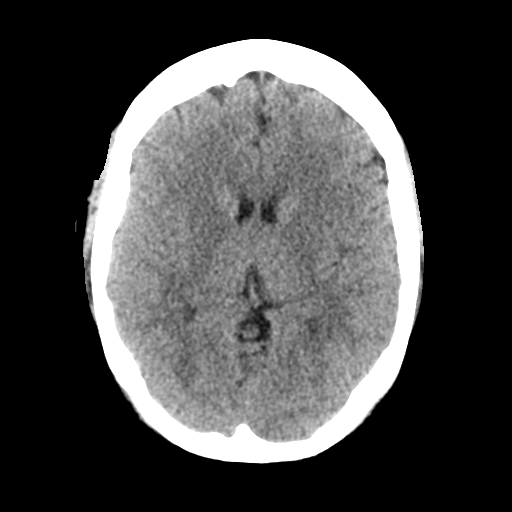
[im 13/28  bone]
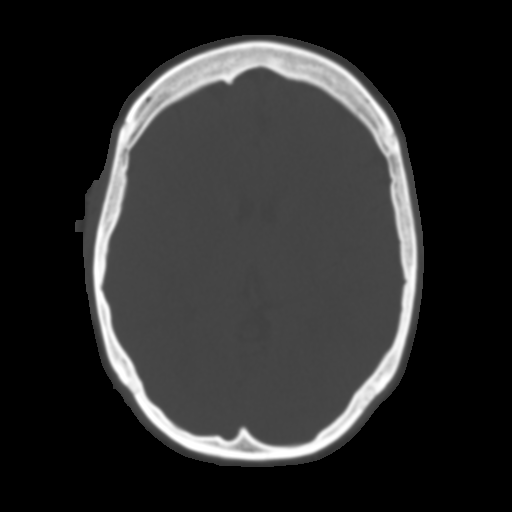
[im 16/28  brain]
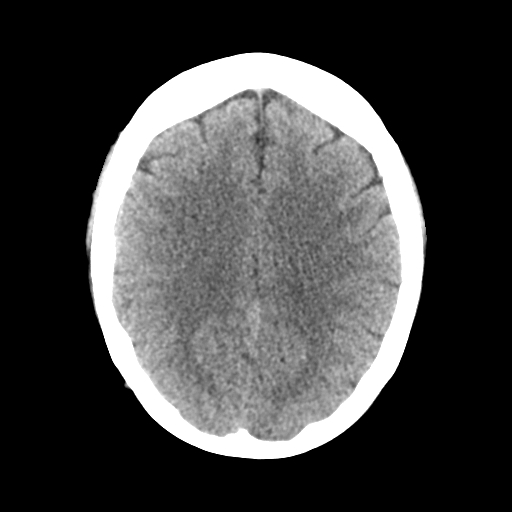
[im 18/28  brain]
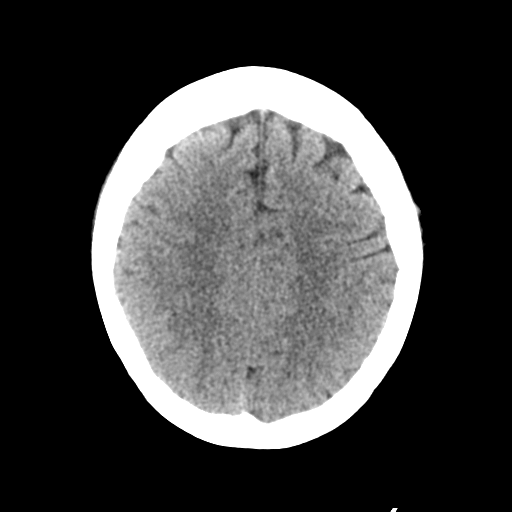
[im 21/28  brain]
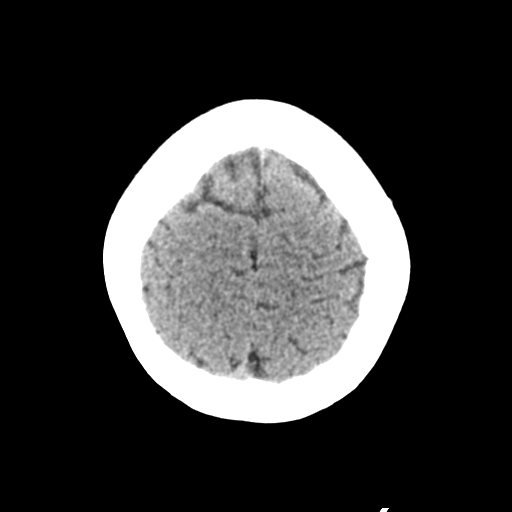
[im 24/28  brain]
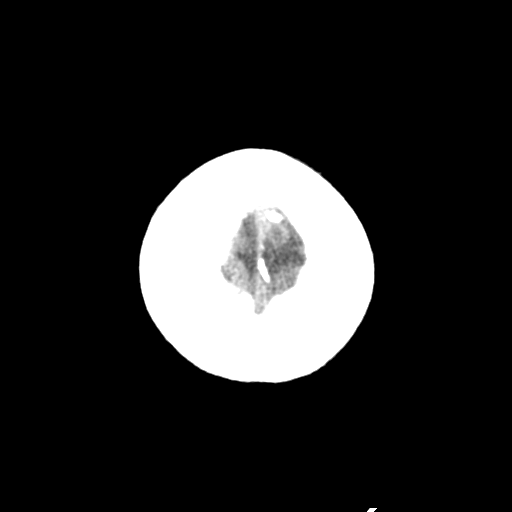
[im 24/28  bone]
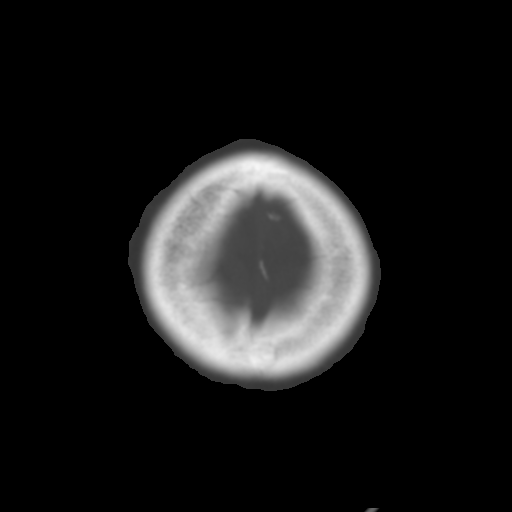
[im 26/28  brain]
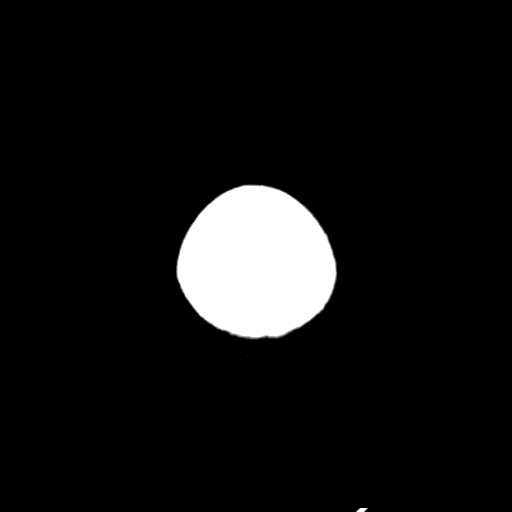

[Series 5: coronal soft tissue · coronal · 0.29mm/px · 3 of 62 slices shown]
[im 21/62  brain]
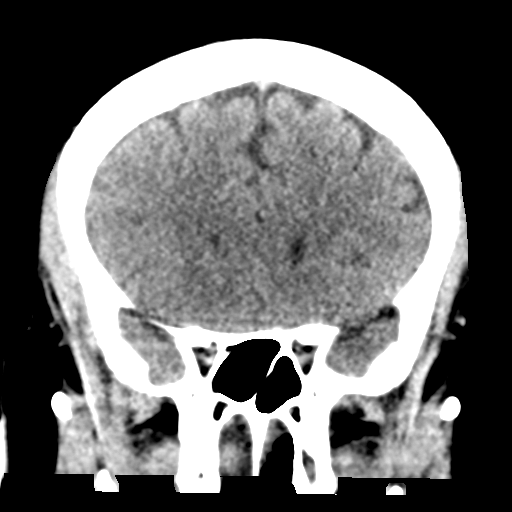
[im 28/62  brain]
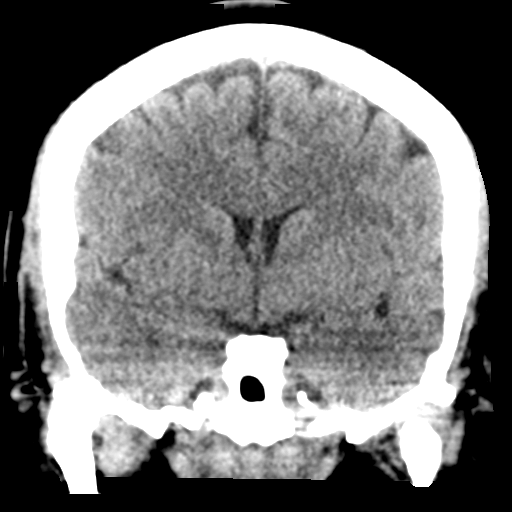
[im 34/62  brain]
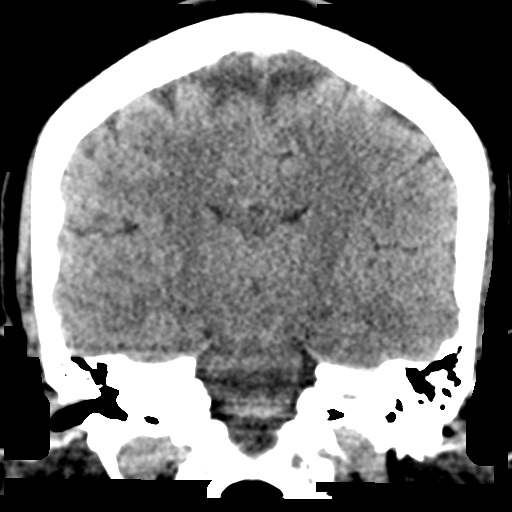

[Series 6: sagittal soft tissue · sagittal · 0.27mm/px · 3 of 48 slices shown]
[im 16/48  brain]
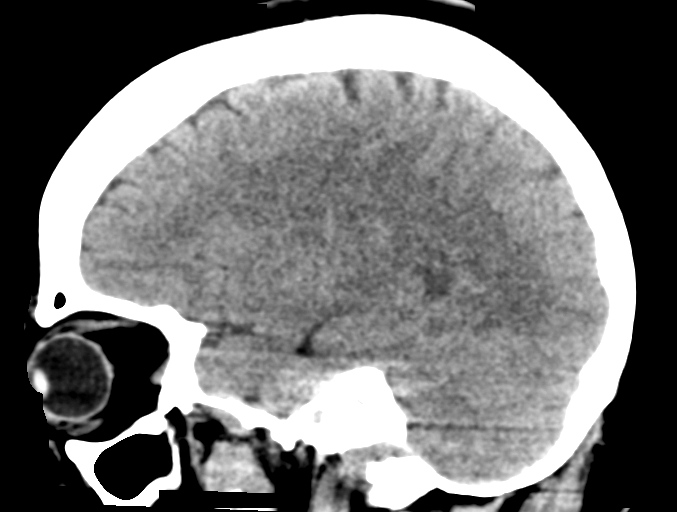
[im 24/48  brain]
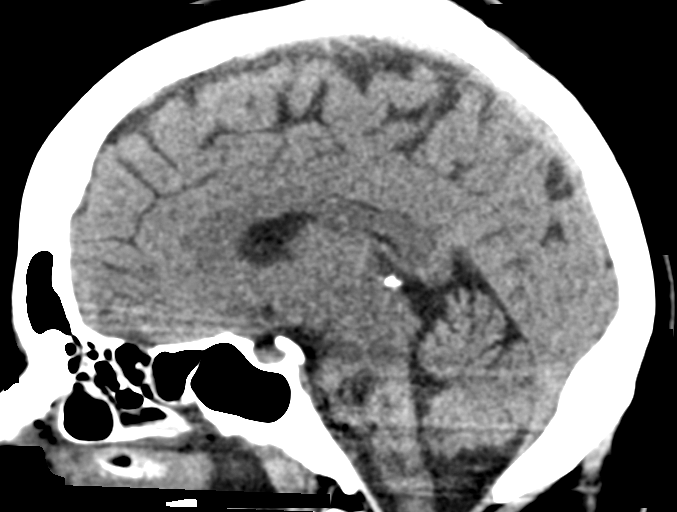
[im 32/48  brain]
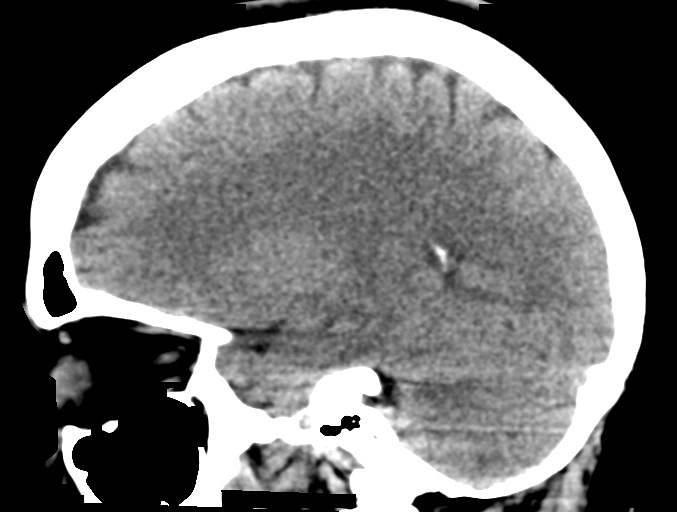

[16 of 45 positions shown; findings below may reference images not displayed]

FINDINGS: Brain: No evidence of acute infarction, hemorrhage, hydrocephalus,
extra-axial collection or mass lesion/mass effect.

Vascular: No hyperdense vessel or unexpected calcification.

Skull: Normal. Negative for fracture or focal lesion.

Sinuses/Orbits: Mucosal thickening in the paranasal sinuses. Mastoid
air cells are not opacified.

Other: None.
IMPRESSION: No acute intracranial abnormalities.

## 2023-08-08 ENCOUNTER — Encounter (HOSPITAL_COMMUNITY): Payer: Self-pay

## 2023-08-08 ENCOUNTER — Other Ambulatory Visit: Payer: Self-pay

## 2023-08-08 NOTE — ED Triage Notes (Signed)
 Left back pain that radiates to left LQ. N/V
